# Patient Record
Sex: Male | Born: 1995 | Race: Black or African American | Hispanic: No | Marital: Single | Smoking: Current every day smoker
Health system: Southern US, Community
[De-identification: ages and names within clinical notes are randomized; demographics above are authoritative.]

## PROBLEM LIST (undated history)

## (undated) DIAGNOSIS — F319 Bipolar disorder, unspecified: Secondary | ICD-10-CM

## (undated) DIAGNOSIS — F1220 Cannabis dependence, uncomplicated: Secondary | ICD-10-CM

## (undated) DIAGNOSIS — F29 Unspecified psychosis not due to a substance or known physiological condition: Secondary | ICD-10-CM

---
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
## 2015-01-11 IMAGING — CT CT HEAD W/O CM
1 series · 16 of 30 positions shown, 20 images · non-contrast
Comparison: None.

CLINICAL DATA: Altered mental status.

EXAM:
CT HEAD WITHOUT CONTRAST
TECHNIQUE: Contiguous axial images were obtained from the base of the skull
through the vertex without intravenous contrast.

[Series 2: head 5.0 h30s · axial · 0.44mm/px · z∈[-95,+40]mm · 16 of 31 slices shown, 20 images]
[im 2/31  brain]
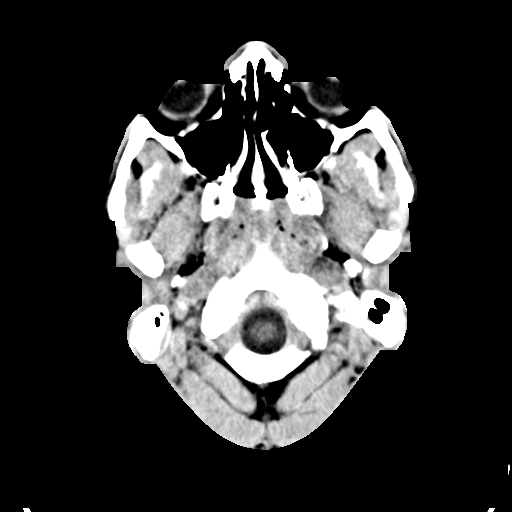
[im 2/31  bone]
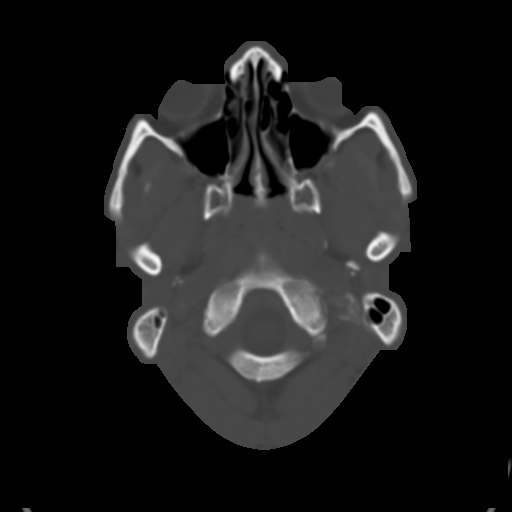
[im 4/31  brain]
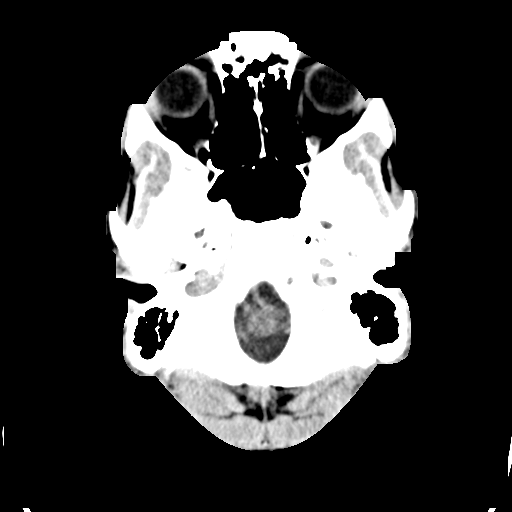
[im 6/31  brain]
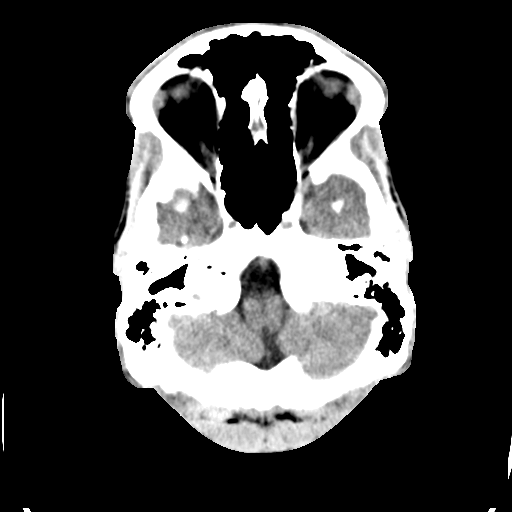
[im 8/31  brain]
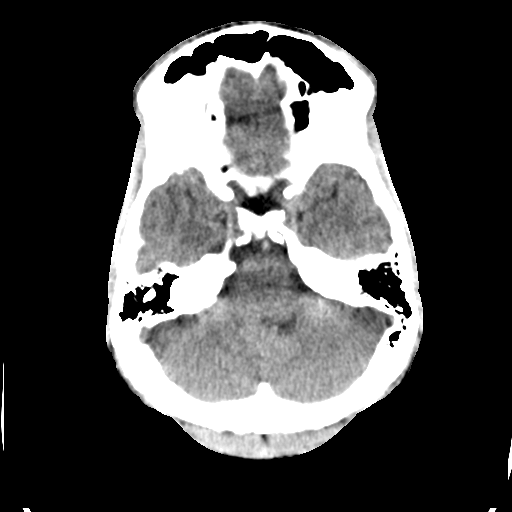
[im 9/31  brain]
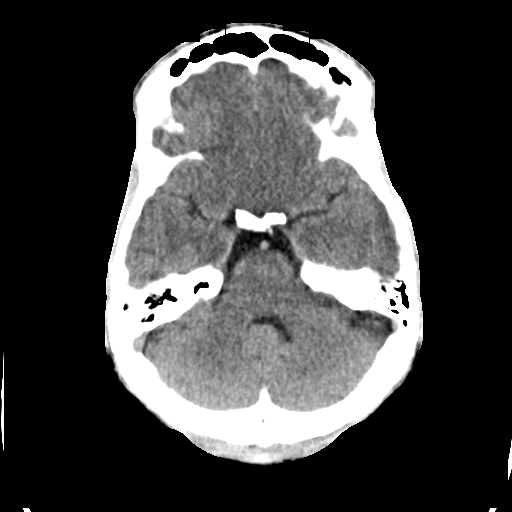
[im 9/31  bone]
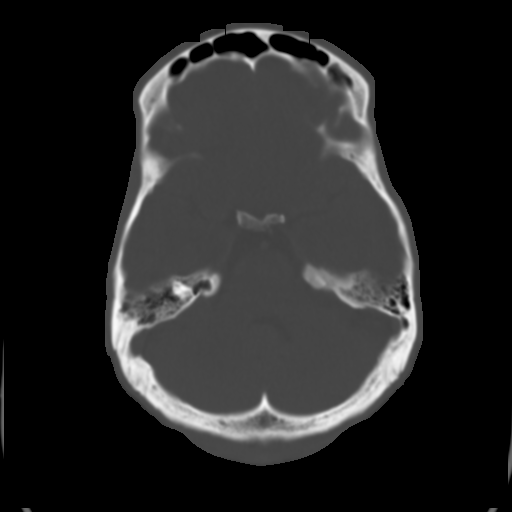
[im 11/31  brain]
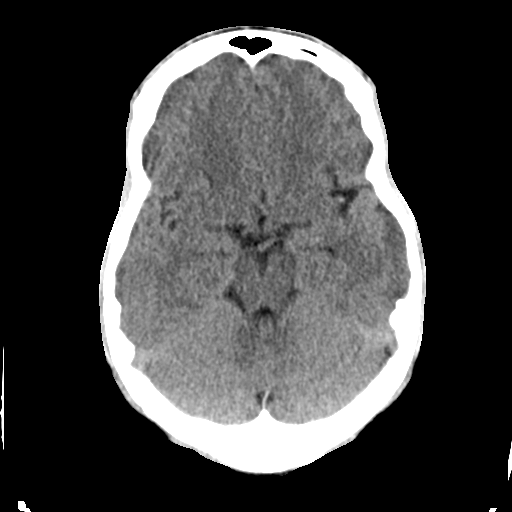
[im 13/31  brain]
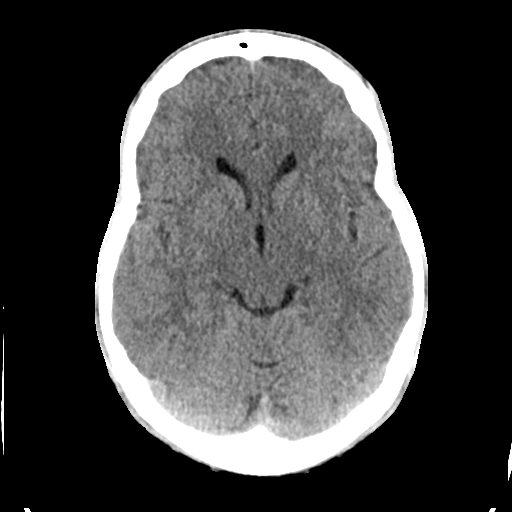
[im 15/31  brain]
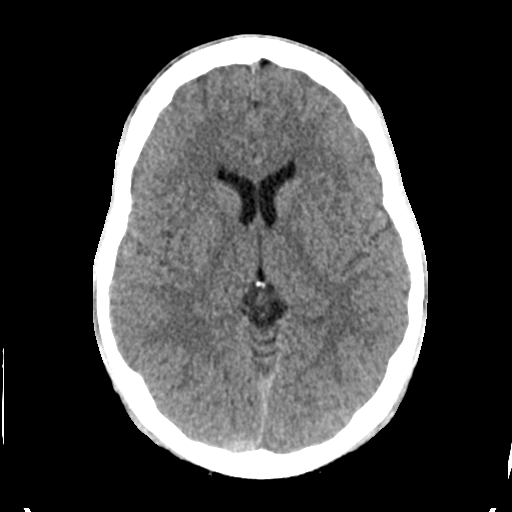
[im 16/31  brain]
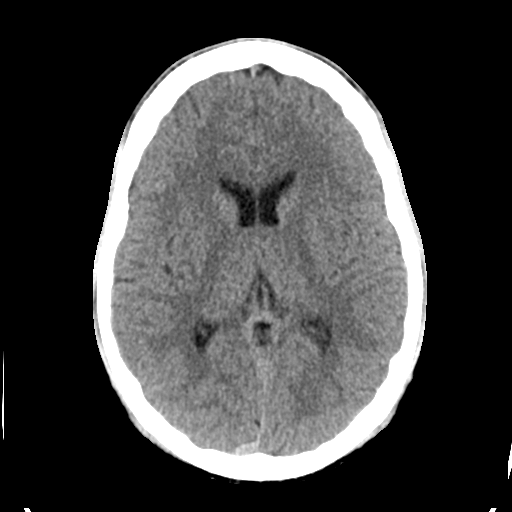
[im 16/31  bone]
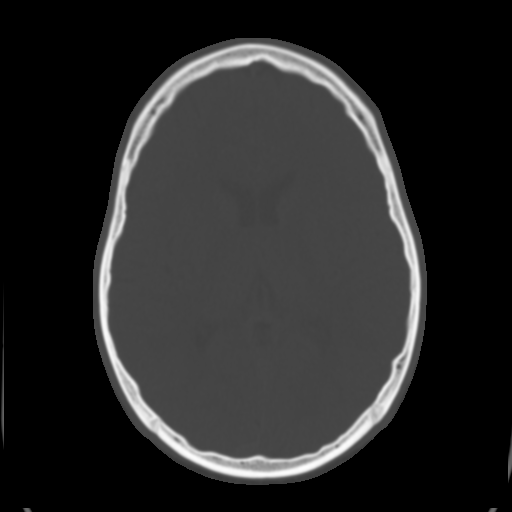
[im 18/31  brain]
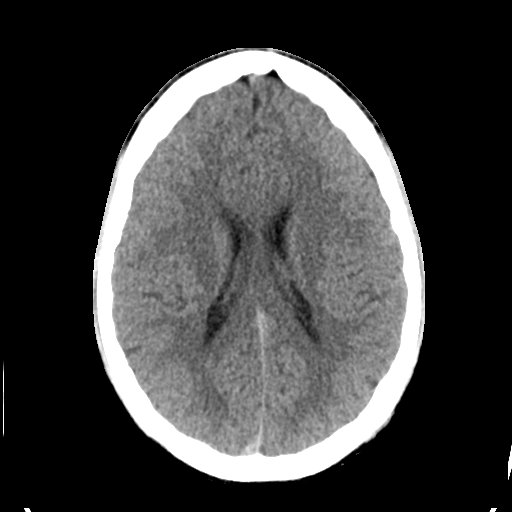
[im 20/31  brain]
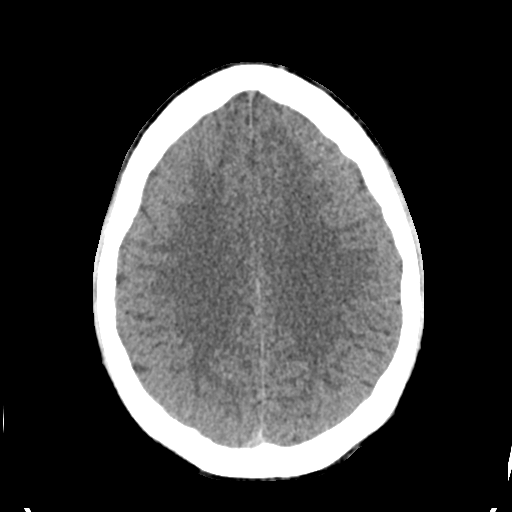
[im 22/31  brain]
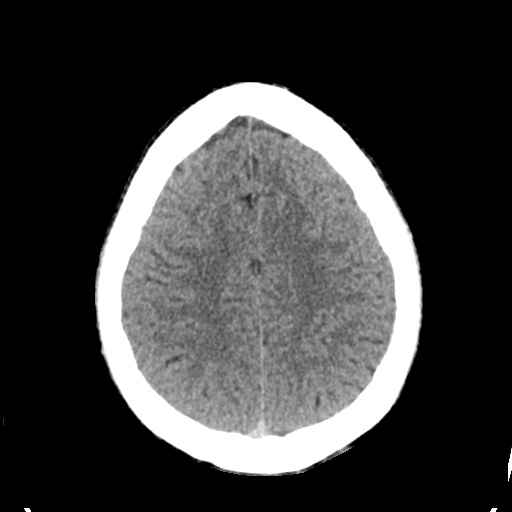
[im 23/31  brain]
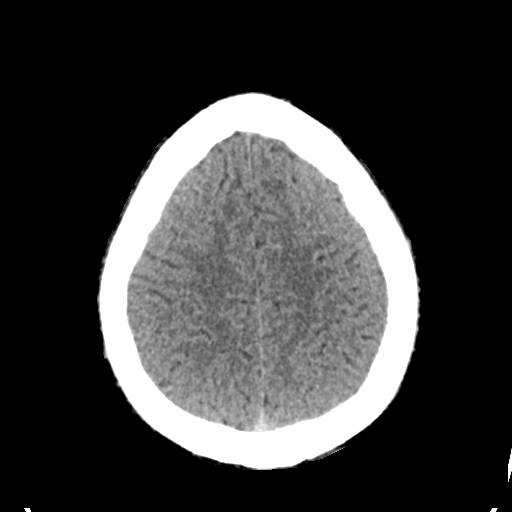
[im 23/31  bone]
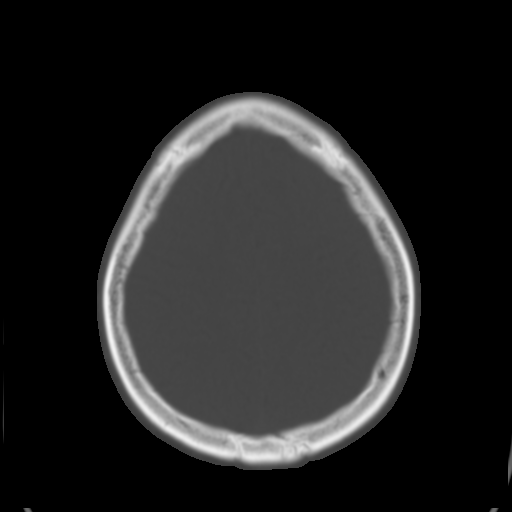
[im 25/31  brain]
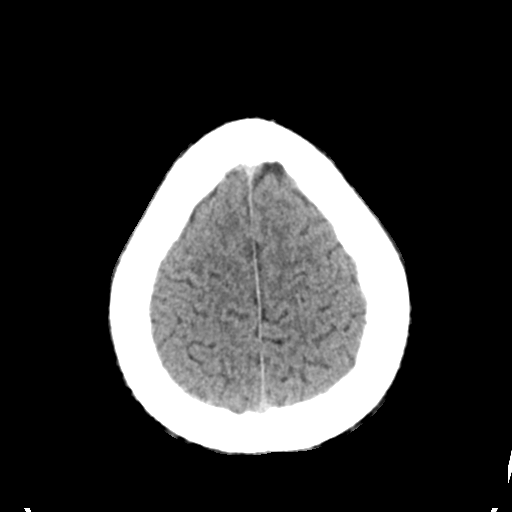
[im 27/31  brain]
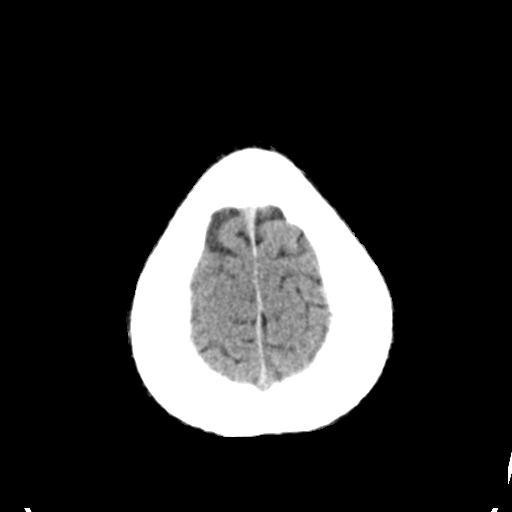
[im 29/31  brain]
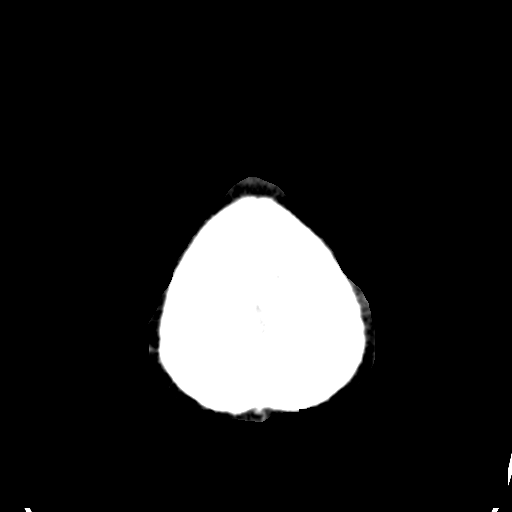

[16 of 30 positions shown; findings below may reference images not displayed]

FINDINGS: Normal appearing cerebral hemispheres and posterior fossa
structures. Normal size and position of the ventricles. No
intracranial hemorrhage, mass lesion or CT evidence of acute
infarction. Pneumatization of the left middle nasal turbinate with
mild deviation of the mid portion of the nasal septum to the right.
IMPRESSION: No acute abnormality.

## 2015-08-07 ENCOUNTER — Emergency Department (HOSPITAL_COMMUNITY)
Admission: EM | Admit: 2015-08-07 | Discharge: 2015-08-09 | Disposition: A | Discharge status: Other Acute Inpatient Hospital | Payer: Medicaid Other | Attending: Emergency Medicine | Admitting: Emergency Medicine

## 2015-08-07 DIAGNOSIS — F17210 Nicotine dependence, cigarettes, uncomplicated: Secondary | ICD-10-CM | POA: Diagnosis not present

## 2015-08-07 DIAGNOSIS — F3110 Bipolar disorder, current episode manic without psychotic features, unspecified: Secondary | ICD-10-CM

## 2015-08-07 DIAGNOSIS — F1220 Cannabis dependence, uncomplicated: Secondary | ICD-10-CM | POA: Diagnosis present

## 2015-08-07 DIAGNOSIS — Z046 Encounter for general psychiatric examination, requested by authority: Secondary | ICD-10-CM | POA: Insufficient documentation

## 2015-08-07 DIAGNOSIS — F319 Bipolar disorder, unspecified: Secondary | ICD-10-CM | POA: Diagnosis not present

## 2015-08-07 HISTORY — DX: Bipolar disorder, unspecified: F31.9

## 2015-08-07 HISTORY — DX: Unspecified psychosis not due to a substance or known physiological condition: F29

## 2015-08-07 HISTORY — DX: Cannabis dependence, uncomplicated: F12.20

## 2015-08-08 ENCOUNTER — Encounter (HOSPITAL_COMMUNITY): Payer: Self-pay | Admitting: Emergency Medicine

## 2015-08-08 DIAGNOSIS — F1220 Cannabis dependence, uncomplicated: Secondary | ICD-10-CM | POA: Diagnosis not present

## 2015-08-08 DIAGNOSIS — F3110 Bipolar disorder, current episode manic without psychotic features, unspecified: Secondary | ICD-10-CM

## 2015-08-08 LAB — COMPREHENSIVE METABOLIC PANEL
ALBUMIN: 4.8 g/dL (ref 3.5–5.0)
ALT: 23 U/L (ref 17–63)
ANION GAP: 12 (ref 5–15)
AST: 29 U/L (ref 15–41)
Alkaline Phosphatase: 57 U/L (ref 38–126)
BILIRUBIN TOTAL: 1.5 mg/dL — AB (ref 0.3–1.2)
BUN: 17 mg/dL (ref 6–20)
CALCIUM: 9.3 mg/dL (ref 8.9–10.3)
CO2: 23 mmol/L (ref 22–32)
Chloride: 102 mmol/L (ref 101–111)
Creatinine, Ser: 1.07 mg/dL (ref 0.61–1.24)
GFR calc Af Amer: >60 mL/min (ref >60)
GFR calc non Af Amer: >60 mL/min (ref >60)
GLUCOSE: 82 mg/dL (ref 65–99)
Potassium: 3.3 mmol/L — ABNORMAL LOW (ref 3.5–5.1)
Sodium: 137 mmol/L (ref 135–145)
Total Protein: 7.6 g/dL (ref 6.5–8.1)

## 2015-08-08 LAB — CBC WITH DIFFERENTIAL/PLATELET
BASOS ABS: 0.1 10*3/uL (ref 0.0–0.1)
BASOS PCT: 1 %
Eosinophils Absolute: 0 10*3/uL (ref 0.0–0.7)
Eosinophils Relative: 0 %
HEMATOCRIT: 42.2 % (ref 39.0–52.0)
HEMOGLOBIN: 14.5 g/dL (ref 13.0–17.0)
LYMPHS PCT: 19 %
Lymphs Abs: 2.1 10*3/uL (ref 0.7–4.0)
MCH: 30.1 pg (ref 26.0–34.0)
MCHC: 34.4 g/dL (ref 30.0–36.0)
MCV: 87.6 fL (ref 78.0–100.0)
Monocytes Absolute: 0.7 10*3/uL (ref 0.1–1.0)
Monocytes Relative: 6 %
NEUTROS ABS: 8.2 10*3/uL — AB (ref 1.7–7.7)
Neutrophils Relative %: 74 %
Platelets: 240 10*3/uL (ref 150–400)
RBC: 4.82 MIL/uL (ref 4.22–5.81)
RDW: 12.8 % (ref 11.5–15.5)
WBC: 11.1 10*3/uL — ABNORMAL HIGH (ref 4.0–10.5)

## 2015-08-08 LAB — SALICYLATE LEVEL: Salicylate Lvl: <4 mg/dL (ref 2.8–30.0)

## 2015-08-08 LAB — ACETAMINOPHEN LEVEL: Acetaminophen (Tylenol), Serum: <10 ug/mL — ABNORMAL LOW (ref 10–30)

## 2015-08-08 LAB — ETHANOL: Alcohol, Ethyl (B): <5 mg/dL (ref <5)

## 2015-08-08 MED ORDER — LORAZEPAM 2 MG/ML IJ SOLN
2.0000 mg | Freq: Once | INTRAMUSCULAR | Status: DC
  Filled 2015-08-08: qty 1

## 2015-08-08 MED ORDER — LORAZEPAM 2 MG/ML IJ SOLN
2.0000 mg | Freq: Once | INTRAMUSCULAR | Status: AC
  Administered 2015-08-08: 2 mg via INTRAMUSCULAR
  Filled 2015-08-08: qty 1

## 2015-08-08 MED ORDER — HYDROXYZINE HCL 25 MG PO TABS
25.0000 mg | ORAL_TABLET | Freq: Two times a day (BID) | ORAL | Status: DC
  Filled 2015-08-08 (×2): qty 1

## 2015-08-08 MED ORDER — TRAZODONE HCL 50 MG PO TABS
150.0000 mg | ORAL_TABLET | Freq: Every day | ORAL | Status: DC
Start: 2015-08-08 — End: 2015-08-09

## 2015-08-08 MED ORDER — LORAZEPAM 1 MG PO TABS: 1.0000 mg | ORAL_TABLET | Freq: Four times a day (QID) | ORAL | Status: DC | PRN

## 2015-08-08 MED ORDER — ACETAMINOPHEN 325 MG PO TABS: 650.0000 mg | ORAL_TABLET | ORAL | Status: DC | PRN

## 2015-08-08 MED ORDER — OXCARBAZEPINE 300 MG PO TABS
300.0000 mg | ORAL_TABLET | Freq: Two times a day (BID) | ORAL | Status: DC
  Filled 2015-08-08 (×2): qty 1

## 2015-08-08 MED ORDER — LORAZEPAM 2 MG/ML IJ SOLN
2.0000 mg | Freq: Once | INTRAMUSCULAR | Status: AC
  Administered 2015-08-08: 2 mg via INTRAMUSCULAR

## 2015-08-08 MED ORDER — DIPHENHYDRAMINE HCL 50 MG/ML IJ SOLN
50.0000 mg | Freq: Once | INTRAMUSCULAR | Status: AC
  Administered 2015-08-08: 50 mg via INTRAMUSCULAR
  Filled 2015-08-08: qty 1

## 2015-08-08 MED ORDER — ZIPRASIDONE MESYLATE 20 MG IM SOLR
20.0000 mg | Freq: Once | INTRAMUSCULAR | Status: AC
  Administered 2015-08-08: 20 mg via INTRAMUSCULAR
  Filled 2015-08-08: qty 20

## 2015-08-08 MED ORDER — DIPHENHYDRAMINE HCL 50 MG/ML IJ SOLN
100.0000 mg | Freq: Once | INTRAMUSCULAR | Status: DC
  Filled 2015-08-08: qty 2

## 2015-08-08 MED ORDER — STERILE WATER FOR INJECTION IJ SOLN
INTRAMUSCULAR | Status: AC
  Administered 2015-08-08: 21:00:00
  Filled 2015-08-08: qty 10

## 2015-08-08 MED ORDER — TRAZODONE HCL 50 MG PO TABS: 150.0000 mg | ORAL_TABLET | Freq: Every day | ORAL | Status: DC

## 2015-08-08 MED ORDER — OLANZAPINE 10 MG PO TBDP
10.0000 mg | ORAL_TABLET | Freq: Three times a day (TID) | ORAL | Status: DC | PRN
  Administered 2015-08-09: 10 mg via ORAL
  Filled 2015-08-08 (×2): qty 1

## 2015-08-08 MED ORDER — ARIPIPRAZOLE 15 MG PO TABS
15.0000 mg | ORAL_TABLET | Freq: Every day | ORAL | Status: DC
  Filled 2015-08-08 (×2): qty 1

## 2015-08-08 MED ORDER — DIPHENHYDRAMINE HCL 50 MG/ML IJ SOLN
100.0000 mg | Freq: Once | INTRAMUSCULAR | Status: AC
  Administered 2015-08-08: 100 mg via INTRAMUSCULAR

## 2015-08-08 MED ORDER — HALOPERIDOL 2 MG PO TABS
2.0000 mg | ORAL_TABLET | Freq: Two times a day (BID) | ORAL | Status: DC
  Filled 2015-08-08: qty 1

## 2015-08-08 MED ORDER — STERILE WATER FOR INJECTION IJ SOLN
INTRAMUSCULAR | Status: AC
  Administered 2015-08-08: 10 mL
  Filled 2015-08-08: qty 10

## 2015-08-08 NOTE — ED Notes (Signed)
Pt. Pacing, refusing to throw supper tray away, cursing when redirection attempted.

## 2015-08-08 NOTE — ED Notes (Signed)
Pt. Transferred to [facility] from ED to [facility] Pt. Oriented to unit including Q15 minute rounds as well as the security cameras for their protection. Patient is alert, warm and dry in no acute distress. Patient denies SI, HI, and AVH. Pt. Encouraged to let me know if needs arise.

## 2015-08-08 NOTE — ED Notes (Signed)
Pt. Allowed injection without complaint.

## 2015-08-08 NOTE — ED Notes (Signed)
Pt has been sleeping most of the afternoon. His appetite is good and he eats all of the food brought to him. He continues to refuse po medications.

## 2015-08-08 NOTE — ED Notes (Signed)
Pt walks around the unit without a shirt on and does not comply with staff request to put his shirt on. He is manic and his speech is rambling, tangential and is not coherent. He uses word salad. Occasionally he turns on the emergency alarm in his room. He seems disoriented and not understanding his admission to this unit. He is not unpleasant with staff or other patients.

## 2015-08-08 NOTE — ED Notes (Signed)
Patient comes from brother house. He has been rambling like this for three days. Patient has not been taking any of his medicine that he was on. Brother could not remember the medicines that patient is on. Patient has not been sleeping. Brother called EMS due to the rambling.

## 2015-08-08 NOTE — ED Notes (Signed)
Patient noted in room. No complaints, stable, in no acute distress. Q15 minute rounds and monitoring via Security Cameras to continue.  

## 2015-08-08 NOTE — ED Provider Notes (Signed)
CSN: [id number]     Arrival date & time 08/07/15  2358 History   .By signing my name below, I, Suzan SlickAshley N. Elon SpannerLeger, attest that this documentation has been prepared under the direction and in the presence of Richardean Canalavid H Dameir Gentzler, MD.  Electronically Signed: Suzan SlickAshley N. Elon SpannerLeger, ED Scribe. 08/08/2015. 12:20 AM.   Chief Complaint  Patient presents with  . Medical Clearance   The history is provided by the patient. No language interpreter was used.     LEVEL 5 CAVEAT DUE TO PSYCHOSIS   HPI Comments: Bryan Tran brought in by EMS from brothers house is a 20 y.o. male with a PMHx of Bipolar disorder who presents to the Emergency Department here for medical clearance this evening. Pt was brought in after his brother called EMS as pt was rambling. He is not currently under any IVC paperwork. Pt states "I will be an attorney that will prosecute everyone." States "I dont care about my family and want harm done with my family." No SI at this time. Pt denies being depressed but has been off of his medication for some time now.  PCP: No PCP Per Patient    Past Medical History  Diagnosis Date  . Bipolar I disorder (HCC) manic  . Psychosis   . Marihuana dependence (HCC)    History reviewed. No pertinent past surgical history. History reviewed. No pertinent family history. Social History  Substance Use Topics  . Smoking status: Never Smoker   . Smokeless tobacco: None  . Alcohol Use: No    Review of Systems  Unable to perform ROS: Psychiatric disorder      Allergies  Risperdal  Home Medications   Prior to Admission medications   Medication Sig Start Date End Date Taking? Authorizing Provider  ARIPiprazole (ABILIFY) 15 MG tablet Take 1 tablet (15 mg total) by mouth daily. Patient not taking: Reported on 08/08/2015 09/17/13   Thermon LeylandLaura A Davis, NP  ARIPiprazole 400 MG SUSR Inject 400 mg into the muscle every 28 (twenty-eight) days. Next dose due 10/10/13. Patient not taking: Reported on 08/08/2015 10/10/13    Thermon LeylandLaura A Davis, NP  carbamazepine (EQUETRO) 200 MG CP12 12 hr capsule Take 1 capsule (200 mg total) by mouth 2 (two) times daily. Patient not taking: Reported on 08/08/2015 09/17/13   Thermon LeylandLaura A Davis, NP  traZODone (DESYREL) 150 MG tablet Take 1 tablet (150 mg total) by mouth at bedtime. Patient not taking: Reported on 08/08/2015 09/17/13   Thermon LeylandLaura A Davis, NP   Triage Vitals: BP 108/69 mmHg  Pulse 70  Temp(Src) 98.8 F (37.1 C) (Oral)  Resp 14  Ht 6' (1.829 m)  Wt 160 lb (72.576 kg)  BMI 21.70 kg/m2  SpO2 95%   Physical Exam  Constitutional: He appears well-developed and well-nourished.  HENT:  Head: Normocephalic.  Eyes: EOM are normal.  Neck: Normal range of motion.  Cardiovascular: Normal rate, regular rhythm and normal heart sounds.   Pulmonary/Chest: Effort normal and breath sounds normal. No respiratory distress. He has no wheezes. He has no rales.  Abdominal: He exhibits no distension.  Musculoskeletal: Normal range of motion.  Neurological: He is alert.  Psychiatric:  Agitated Poor judgement  Refuses to answer many questions   Nursing note and vitals reviewed.   ED Course  Procedures (including critical care time)  DIAGNOSTIC STUDIES: Oxygen Saturation is 100% on RA, Normal by my interpretation.    COORDINATION OF CARE: 12:03 AM- Will order ethanol, blood work. Will give Ativan and Benadryl.  Discussed treatment plan with pt at bedside and pt agreed to plan.     Labs Review Labs Reviewed  CBC WITH DIFFERENTIAL/PLATELET - Abnormal; Notable for the following:    WBC 11.1 (*)    Neutro Abs 8.2 (*)    All other components within normal limits  COMPREHENSIVE METABOLIC PANEL - Abnormal; Notable for the following:    Potassium 3.3 (*)    Total Bilirubin 1.5 (*)    All other components within normal limits  ACETAMINOPHEN LEVEL - Abnormal; Notable for the following:    Acetaminophen (Tylenol), Serum <10 (*)    All other components within normal limits  ETHANOL   SALICYLATE LEVEL  URINE RAPID DRUG SCREEN, HOSP PERFORMED    Imaging Review No results found. I have personally reviewed and evaluated these images and lab results as part of my medical decision-making.   EKG Interpretation None      MDM   Final diagnoses:  None    Bryan Tran is a 20 y.o. male here with psychosis. Patient has hx of bipolar and was hospitalized before. He is psychotic and agitated. IVC paperwork done by me. Given haldol, ativan.   4:17 AM Labs unremarkable. Medically cleared. TTS to see patient.    I personally performed the services described in this documentation, which was scribed in my presence. The recorded information has been reviewed and is accurate.   Richardean Canal, MD 08/08/15 [phone]

## 2015-08-08 NOTE — ED Notes (Signed)
Patient noted sleeping in room. No complaints, stable, in no acute distress. Q15 minute rounds and monitoring via Security Cameras to continue.  

## 2015-08-08 NOTE — ED Notes (Signed)
Pt. Responding to internal stimuli, intermittently cursing.

## 2015-08-08 NOTE — Consult Note (Signed)
[facility] Consult   Reason for Consult:  Hallucinations  Referring Physician:  EDP Patient Identification: Panagiotis Oelkers MRN:  [id number] Principal Diagnosis: Bipolar I disorder, most recent episode (or current) manic ([place]) Diagnosis:   Patient Active Problem List   Diagnosis Date Noted  . Cannabis use disorder, severe, dependence ([place]) [F12.20] 08/08/2015    Priority: High  . Bipolar I disorder, most recent episode (or current) manic ([place]) [F31.10] 09/10/2013    Priority: High  . Marijuana dependence ([place]) [F12.20] 09/07/2013  . Unspecified psychosis [F29] 09/06/2013  . Psychosis [F29] 09/06/2013    Total Time spent with patient: 45 minutes  Subjective:   Bryan Tran is a 20 y.o. male patient admitted with mania.  HPI:  20 yo male who presented to the ED with mania.  He was restless earlier and delusional thinking he was from [place] with an accent.  Pleasant and took Geodon IM willingly.  He uses marijuana daily, large amounts.  Denies suicidal/homicidal ideations and alcohol abuse.  He is tangential with increase in energy, admission needed.  Past Psychiatric History: bipolar disorder, marijuana abuse  Risk to Self: Suicidal Ideation:  (UTA) Suicidal Intent:  (UTA) Is patient at risk for suicide?:  (UTA) Suicidal Plan?:  (UTA) Access to Means:  (UTA) What has been your use of drugs/alcohol within the last 12 months?:  (UTA) How many times?:  (UTA) Other Self Harm Risks:  (UTA) Triggers for Past Attempts:  (UTA) Intentional Self Injurious Behavior:  (UTA) Risk to Others: Homicidal Ideation:  (UTA) Thoughts of Harm to Others:  (UTA) Current Homicidal Intent:  (UTA) Current Homicidal Plan:  (UTA) Access to Homicidal Means:  (UTA) Identified Victim:  (UTA) History of harm to others?:  (UTA) Assessment of Violence:  (UTA) Violent Behavior Description:  (UTA) Does patient have access to weapons?:  (UTA) Criminal Charges Pending?:  (UTA) Does  patient have a court date:  Special educational needs teacher) Prior Inpatient Therapy: Prior Inpatient Therapy:  (UTA) Prior Therapy Dates:  (UTA) Prior Therapy Facilty/Provider(s):  (UTA) Reason for Treatment:  (UTA) Prior Outpatient Therapy: Prior Outpatient Therapy:  (UTA) Prior Therapy Dates:  (UTA) Prior Therapy Facilty/Provider(s):  (UTA) Reason for Treatment:  (UTA) Does patient have an ACCT team?:  (UTA) Does patient have Intensive In-House Services?  : Unknown Does patient have [facility] services? : Unknown Does patient have P4CC services?: Unknown  Past Medical History:  Past Medical History  Diagnosis Date  . Bipolar I disorder (HCC) manic  . Psychosis   . Marihuana dependence ([place])    History reviewed. No pertinent past surgical history. Family History: History reviewed. No pertinent family history. Family Psychiatric  History: none Social History:  History  Alcohol Use No     History  Drug Use  . Yes  . Special: Marijuana    Social History   Social History  . Marital Status: Single    Spouse Name: N/A  . Number of Children: N/A  . Years of Education: N/A   Social History Main Topics  . Smoking status: Never Smoker   . Smokeless tobacco: None  . Alcohol Use: No  . Drug Use: Yes    Special: Marijuana  . Sexual Activity: Not Asked   Other Topics Concern  . None   Social History Narrative   Additional Social History:    Allergies:   Allergies  Allergen Reactions  . Risperdal [Risperidone]     Patient notes that he complained of "tongue getting thick and having difficulty speaking."  Labs:  Results for orders placed or performed during the hospital encounter of 08/07/15 (from the past 48 hour(s))  CBC with Differential     Status: Abnormal   Collection Time: 08/08/15  1:37 AM  Result Value Ref Range   WBC 11.1 (H) 4.0 - 10.5 K/uL   RBC 4.82 4.22 - 5.81 MIL/uL   Hemoglobin 14.5 13.0 - 17.0 g/dL   HCT 42.2 39.0 - 52.0 %   MCV 87.6 78.0 - 100.0 fL   MCH 30.1 26.0 -  34.0 pg   MCHC 34.4 30.0 - 36.0 g/dL   RDW 12.8 11.5 - 15.5 %   Platelets 240 150 - 400 K/uL   Neutrophils Relative % 74 %   Lymphocytes Relative 19 %   Monocytes Relative 6 %   Eosinophils Relative 0 %   Basophils Relative 1 %   Neutro Abs 8.2 (H) 1.7 - 7.7 K/uL   Lymphs Abs 2.1 0.7 - 4.0 K/uL   Monocytes Absolute 0.7 0.1 - 1.0 K/uL   Eosinophils Absolute 0.0 0.0 - 0.7 K/uL   Basophils Absolute 0.1 0.0 - 0.1 K/uL   WBC Morphology ATYPICAL LYMPHOCYTES   Comprehensive metabolic panel     Status: Abnormal   Collection Time: 08/08/15  1:37 AM  Result Value Ref Range   Sodium 137 135 - 145 mmol/L   Potassium 3.3 (L) 3.5 - 5.1 mmol/L   Chloride 102 101 - 111 mmol/L   CO2 23 22 - 32 mmol/L   Glucose, Bld 82 65 - 99 mg/dL   BUN 17 6 - 20 mg/dL   Creatinine, Ser 1.07 0.61 - 1.24 mg/dL   Calcium 9.3 8.9 - 10.3 mg/dL   Total Protein 7.6 6.5 - 8.1 g/dL   Albumin 4.8 3.5 - 5.0 g/dL   AST 29 15 - 41 U/L   ALT 23 17 - 63 U/L   Alkaline Phosphatase 57 38 - 126 U/L   Total Bilirubin 1.5 (H) 0.3 - 1.2 mg/dL   GFR calc non Af Amer >60 >60 mL/min   GFR calc Af Amer >60 >60 mL/min    Comment: (NOTE) The eGFR has been calculated using the CKD EPI equation. This calculation has not been validated in all clinical situations. eGFR's persistently <60 mL/min signify possible Chronic Kidney Disease.    Anion gap 12 5 - 15  Ethanol     Status: None   Collection Time: 08/08/15  1:37 AM  Result Value Ref Range   Alcohol, Ethyl (B) <5 <5 mg/dL    Comment:        LOWEST DETECTABLE LIMIT FOR SERUM ALCOHOL IS 5 mg/dL FOR MEDICAL PURPOSES ONLY   Salicylate level     Status: None   Collection Time: 08/08/15  1:37 AM  Result Value Ref Range   Salicylate Lvl <1.6 2.8 - 30.0 mg/dL  Acetaminophen level     Status: Abnormal   Collection Time: 08/08/15  1:37 AM  Result Value Ref Range   Acetaminophen (Tylenol), Serum <10 (L) 10 - 30 ug/mL    Comment:        THERAPEUTIC CONCENTRATIONS  VARY SIGNIFICANTLY. A RANGE OF 10-30 ug/mL MAY BE AN EFFECTIVE CONCENTRATION FOR MANY PATIENTS. HOWEVER, SOME ARE BEST TREATED AT CONCENTRATIONS OUTSIDE THIS RANGE. ACETAMINOPHEN CONCENTRATIONS >150 ug/mL AT 4 HOURS AFTER INGESTION AND >50 ug/mL AT 12 HOURS AFTER INGESTION ARE OFTEN ASSOCIATED WITH TOXIC REACTIONS.     Current Facility-Administered Medications  Medication Dose Route Frequency Provider Last Rate Last  Dose  . acetaminophen (TYLENOL) tablet 650 mg  650 mg Oral Q4H PRN Wandra Arthurs, MD      . haloperidol (HALDOL) tablet 2 mg  2 mg Oral BID Corena Pilgrim, MD      . hydrOXYzine (ATARAX/VISTARIL) tablet 25 mg  25 mg Oral BID PC Ralf Konopka, MD      . OLANZapine zydis (ZYPREXA) disintegrating tablet 10 mg  10 mg Oral Q8H PRN Zygmund Passero, MD      . Oxcarbazepine (TRILEPTAL) tablet 300 mg  300 mg Oral BID Cordelro Gautreau, MD      . traZODone (DESYREL) tablet 150 mg  150 mg Oral QHS Corena Pilgrim, MD       Current Outpatient Prescriptions  Medication Sig Dispense Refill  . ARIPiprazole (ABILIFY) 15 MG tablet Take 1 tablet (15 mg total) by mouth daily. (Patient not taking: Reported on 08/08/2015) 30 tablet 0  . ARIPiprazole 400 MG SUSR Inject 400 mg into the muscle every 28 (twenty-eight) days. Next dose due 10/10/13. (Patient not taking: Reported on 08/08/2015) 1 each 3  . carbamazepine (EQUETRO) 200 MG CP12 12 hr capsule Take 1 capsule (200 mg total) by mouth 2 (two) times daily. (Patient not taking: Reported on 08/08/2015) 60 each 0  . traZODone (DESYREL) 150 MG tablet Take 1 tablet (150 mg total) by mouth at bedtime. (Patient not taking: Reported on 08/08/2015) 30 tablet 0    Musculoskeletal: Strength & Muscle Tone: within normal limits Gait & Station: normal Patient leans: N/A  Psychiatric Specialty Exam: Physical Exam  Constitutional: He is oriented to person, place, and time. He appears well-developed and well-nourished.  HENT:  Head: Normocephalic.  Neck:  Normal range of motion.  Respiratory: Effort normal.  GI: Soft.  Musculoskeletal: Normal range of motion.  Neurological: He is alert and oriented to person, place, and time.  Skin: Skin is warm and dry.  Psychiatric: His speech is normal. His mood appears anxious. He is actively hallucinating. Thought content is delusional. Cognition and memory are impaired. He expresses impulsivity.    Review of Systems  Constitutional: Negative.   HENT: Negative.   Eyes: Negative.   Respiratory: Negative.   Cardiovascular: Negative.   Gastrointestinal: Negative.   Genitourinary: Negative.   Musculoskeletal: Negative.   Skin: Negative.   Neurological: Negative.   Endo/Heme/Allergies: Negative.   Psychiatric/Behavioral: Positive for hallucinations and substance abuse.    Blood pressure 138/80, pulse 55, temperature 97.8 F (36.6 C), temperature source Oral, resp. rate 16, height 6' (1.829 m), weight 72.576 kg (160 lb), SpO2 99 %.Body mass index is 21.7 kg/(m^2).  General Appearance: Casual  Eye Contact:  Good  Speech:  Pressured  Volume:  Normal  Mood:  Anxious and Euphoric  Affect:  Congruent  Thought Process:  Descriptions of Associations: Tangential  Orientation:  Full (Time, Place, and Person)  Thought Content:  Delusions and Hallucinations: Auditory  Suicidal Thoughts:  No  Homicidal Thoughts:  No  Memory:  Immediate;   Fair Recent;   Poor Remote;   Good  Judgement:  Impaired  Insight:  Lacking  Psychomotor Activity:  Increased  Concentration:  Concentration: Poor and Attention Span: Poor  Recall:  [employer] of Knowledge:  Fair  Language:  Good  Akathisia:  No  Handed:  Right  AIMS (if indicated):     Assets:  Leisure Time Physical Health Resilience Social Support  ADL's:  Intact  Cognition:  Impaired,  Mild  Sleep:  Treatment Plan Summary: Daily contact with patient to assess and evaluate symptoms and progress in treatment, Medication management and Plan bipolar  affective disorder, mania, severe with psychosis:  -Crisis stabilization -Medication management:  Geodon 20 mg once for agitation IM, last night Benadryl 50 mg IM and Ativan 2 mg IM given for agitation.  Started Haldol 2 mg BID for psychosis, Vistaril 25 mg BID for anxiety, Zyprexa 10 mg every 8 hours PRN agitation, Trazodone 150 mg at bedtime for sleep, and Trileptal 300 mg BID for mood stabilization. -Individual and substance counseling  Disposition: Recommend psychiatric Inpatient admission when medically cleared.  Waylan Boga, NP 08/08/2015 12:55 PM   Patient seen face-to-face for psychiatric evaluation, chart reviewed and case discussed with the physician extender and developed treatment plan. Reviewed the information documented and agree with the treatment plan. Corena Pilgrim, MD

## 2015-08-08 NOTE — ED Notes (Signed)
Pt. Attempting to grab electronic signature pads. Pads pulled into nurses station and redirection attempted with limited success.

## 2015-08-08 NOTE — BH Assessment (Signed)
Assessment Note  Bryan Tran is an 20 y.o. male placed under IVC by Dr. Silverio LayYao in [facility].  IVC states:  Patient has been hallucinating and thinks that he is an attorney and will prosecute all crime. He has been threatening to harm his family. Hasn't been taking his psych meds and had previous psych admissions.    Patient appeared to be experiencing word salad during the assessment. Patient was unable to provide his name and date of birth to this Clinical research associatewriter. Patient would speak in word salad to answer questions. Patient denies SI and HI to his nurse. Per chart review [facility] with Unspecified Schizophrenia Spectrum, Acute Mania, and Cannabis Use Disorder. Patient UDS not collected and BAL <5 at time of assessment.   Consulted with Maryjean Mornharles Kober, PA-C who recommends inpatient treatment at this time.    Diagnosis: Unspecified Schizophrenia Spectrum per patient history  Past Medical History:  Past Medical History  Diagnosis Date  . Bipolar I disorder (HCC) manic  . Psychosis   . Marihuana dependence (HCC)     History reviewed. No pertinent past surgical history.  Family History: History reviewed. No pertinent family history.  Social History:  reports that he has never smoked. He does not have any smokeless tobacco history on file. He reports that he uses illicit drugs (Marijuana). He reports that he does not drink alcohol.  Additional Social History:  Alcohol / Drug Use Pain Medications: See PTA Prescriptions: See PTA Over the Counter: See PTA History of alcohol / drug use?:  (UTA)  CIWA: CIWA-Ar BP: 108/69 mmHg Pulse Rate: 70 COWS:    Allergies:  Allergies  Allergen Reactions  . Risperdal [Risperidone]     Patient notes that he complained of "tongue getting thick and having difficulty speaking."    Home Medications:  (Not in a hospital admission)  OB/GYN Status:  No LMP for male patient.  General Assessment Data Location of  Assessment: [facility] TTS Assessment: In system Is this a Tele or Face-to-Face Assessment?: Face-to-Face Is this an Initial Assessment or a Re-assessment for this encounter?: Initial Assessment Marital status:  (UTA) Is patient pregnant?: No Pregnancy Status: No Living Arrangements:  (UTA) Can pt return to current living arrangement?:  (UTA) Admission Status: Involuntary Is patient capable of signing voluntary admission?: No     Crisis Care Plan Living Arrangements:  (UTA) Name of Psychiatrist:  (UTA) Name of Therapist:  67(UTA)  Education Status Is patient currently in school?:  (UTA) Highest grade of school patient has completed:  (UTA)  Risk to self with the past 6 months Suicidal Ideation:  (UTA) Has patient been a risk to self within the past 6 months prior to admission? :  (UTA) Suicidal Intent:  (UTA) Has patient had any suicidal intent within the past 6 months prior to admission? :  (UTA) Is patient at risk for suicide?:  (UTA) Suicidal Plan?:  (UTA) Has patient had any suicidal plan within the past 6 months prior to admission? :  (UTA) Access to Means:  (UTA) What has been your use of drugs/alcohol within the last 12 months?:  (UTA) Previous Attempts/Gestures:  (UTA) How many times?:  (UTA) Other Self Harm Risks:  (UTA) Triggers for Past Attempts:  (UTA) Intentional Self Injurious Behavior:  (UTA) Family Suicide History:  (UTA) Recent stressful life event(s):  (UTA) Persecutory voices/beliefs?:  (UTA) Depression:  (UTA) Depression Symptoms:  (UTA) Substance abuse history and/or treatment for substance abuse?:  (UTA) Suicide  prevention information given to non-admitted patients:  (UTA)  Risk to Others within the past 6 months Homicidal Ideation:  (UTA) Does patient have any lifetime risk of violence toward others beyond the six months prior to admission? :  (UTA) Thoughts of Harm to Others:  (UTA) Current Homicidal Intent:  (UTA) Current Homicidal Plan:   (UTA) Access to Homicidal Means:  (UTA) Identified Victim:  (UTA) History of harm to others?:  (UTA) Assessment of Violence:  (UTA) Violent Behavior Description:  (UTA) Does patient have access to weapons?:  (UTA) Criminal Charges Pending?:  (UTA) Does patient have a court date:  (UTA) Is patient on probation?:  (UTA)  Psychosis Hallucinations:  (UTA) Delusions:  (UTA)  Mental Status Report Appearance/Hygiene: Unremarkable Eye Contact: Unable to Assess Motor Activity: Restlessness Speech: Word salad Level of Consciousness: Unable to assess Thought Processes: Unable to Assess Judgement: Unable to Assess Orientation: Unable to assess Obsessive Compulsive Thoughts/Behaviors: Unable to Assess  Cognitive Functioning Concentration: Unable to Assess Memory: Unable to Assess Insight: Unable to Assess Impulse Control: Unable to Assess Sleep: Unable to Assess Total Hours of Sleep:  (UTA)  ADLScreening [facility] Assessment Services) Patient's cognitive ability adequate to safely complete daily activities?: Yes Patient able to express need for assistance with ADLs?:  (UTA) Independently performs ADLs?:  (UTA)  Prior Inpatient Therapy Prior Inpatient Therapy:  (UTA) Prior Therapy Dates:  (UTA) Prior Therapy Facilty/Provider(s):  (UTA) Reason for Treatment:  (UTA)  Prior Outpatient Therapy Prior Outpatient Therapy:  (UTA) Prior Therapy Dates:  (UTA) Prior Therapy Facilty/Provider(s):  (UTA) Reason for Treatment:  (UTA) Does patient have an ACCT team?:  (UTA) Does patient have Intensive In-House Services?  : Unknown Does patient have [facility] services? : Unknown Does patient have P4CC services?: Unknown  ADL Screening (condition at time of admission) Patient's cognitive ability adequate to safely complete daily activities?: Yes Is the patient deaf or have difficulty hearing?:  (UTA) Does the patient have difficulty seeing, even when wearing glasses/contacts?:  (UTA) Does the  patient have difficulty concentrating, remembering, or making decisions?:  (UTA) Patient able to express need for assistance with ADLs?:  (UTA) Does the patient have difficulty dressing or bathing?:  (UTA) Independently performs ADLs?:  (UTA) Does the patient have difficulty walking or climbing stairs?:  (UTA)  Home Assistive Devices/Equipment Home Assistive Devices/Equipment:  (UTA)    Abuse/Neglect Assessment (Assessment to be complete while patient is alone) Physical Abuse:  (UTA) Verbal Abuse:  (UTA) Sexual Abuse:  (UTA) Exploitation of patient/patient's resources:  (UTA) Self-Neglect:  (UTA) Values / Beliefs Cultural Requests During Hospitalization:  (UTA) Spiritual Requests During Hospitalization:  (UTA)   Advance Directives (For Healthcare) Does patient have an advance directive?:  (UTA due to patient mental state) Would patient like information on creating an advanced directive?:  (UTA due to patient mental state)          Disposition:  Disposition Initial Assessment Completed for this Encounter: Yes Disposition of Patient: Inpatient treatment program (per Maryjean Morn, PA-C) Type of inpatient treatment program: Adult  On Site Evaluation by:   Reviewed with Physician:    Bryan Tran 08/08/2015 6:27 AM

## 2015-08-08 NOTE — BH Assessment (Signed)
Assessment completed.  Consulted with Maryjean Mornharles Kober, PA-C who recommends inpatient treatment at this time.   Bryan PokeJoVea Elanie Hammitt, LCSW Therapeutic Triage Specialist [facility] 08/08/2015 4:20 AM

## 2015-08-08 NOTE — ED Notes (Signed)
Report received from Diane RN. Patient alert, warm and dry, in no acute distress. Patient denies SI, HI, AVH and pain. Patient made aware of Q15 minute rounds and security cameras for their safety. Patient instructed to come to me with needs or concerns.

## 2015-08-08 NOTE — ED Notes (Signed)
Pt. States he is leaving at 2100 with or without our help.

## 2015-08-08 NOTE — ED Notes (Signed)
Patient noted in room. Pt. Placed chair and overbed table in front of door. Q15 minute rounds and monitoring via [employer] Cameras to continue.

## 2015-08-09 ENCOUNTER — Inpatient Hospital Stay (HOSPITAL_COMMUNITY)
Admission: AD | Admit: 2015-08-09 | Discharge: 2015-08-22 | DRG: 885 | Disposition: A | Discharge status: Home / Self Care | Payer: Medicaid Other | Source: Intra-hospital | Attending: Psychiatry | Admitting: Psychiatry

## 2015-08-09 ENCOUNTER — Encounter (HOSPITAL_COMMUNITY): Payer: Self-pay

## 2015-08-09 DIAGNOSIS — F1220 Cannabis dependence, uncomplicated: Secondary | ICD-10-CM | POA: Diagnosis not present

## 2015-08-09 DIAGNOSIS — Z8249 Family history of ischemic heart disease and other diseases of the circulatory system: Secondary | ICD-10-CM

## 2015-08-09 DIAGNOSIS — Z9119 Patient's noncompliance with other medical treatment and regimen: Secondary | ICD-10-CM

## 2015-08-09 DIAGNOSIS — F429 Obsessive-compulsive disorder, unspecified: Secondary | ICD-10-CM | POA: Diagnosis present

## 2015-08-09 DIAGNOSIS — F17210 Nicotine dependence, cigarettes, uncomplicated: Secondary | ICD-10-CM | POA: Diagnosis present

## 2015-08-09 DIAGNOSIS — F312 Bipolar disorder, current episode manic severe with psychotic features: Secondary | ICD-10-CM | POA: Diagnosis not present

## 2015-08-09 DIAGNOSIS — Z818 Family history of other mental and behavioral disorders: Secondary | ICD-10-CM | POA: Diagnosis not present

## 2015-08-09 DIAGNOSIS — F319 Bipolar disorder, unspecified: Secondary | ICD-10-CM | POA: Diagnosis not present

## 2015-08-09 DIAGNOSIS — G4700 Insomnia, unspecified: Secondary | ICD-10-CM | POA: Diagnosis present

## 2015-08-09 DIAGNOSIS — F3110 Bipolar disorder, current episode manic without psychotic features, unspecified: Secondary | ICD-10-CM | POA: Diagnosis not present

## 2015-08-09 MED ORDER — HALOPERIDOL LACTATE 5 MG/ML IJ SOLN
INTRAMUSCULAR | Status: AC
  Filled 2015-08-09: qty 1

## 2015-08-09 MED ORDER — HALOPERIDOL LACTATE 5 MG/ML IJ SOLN
5.0000 mg | Freq: Once | INTRAMUSCULAR | Status: AC
  Administered 2015-08-09: 5 mg via INTRAMUSCULAR

## 2015-08-09 MED ORDER — NICOTINE POLACRILEX 2 MG MT GUM
2.0000 mg | CHEWING_GUM | OROMUCOSAL | Status: DC | PRN
  Administered 2015-08-09 (×3): 2 mg via ORAL
  Filled 2015-08-09 (×4): qty 1

## 2015-08-09 MED ORDER — OLANZAPINE 10 MG PO TBDP
10.0000 mg | ORAL_TABLET | Freq: Three times a day (TID) | ORAL | Status: DC | PRN
  Administered 2015-08-09: 10 mg via ORAL
  Filled 2015-08-09: qty 1

## 2015-08-09 MED ORDER — HYDROXYZINE HCL 25 MG PO TABS
25.0000 mg | ORAL_TABLET | Freq: Two times a day (BID) | ORAL | Status: DC
  Administered 2015-08-10 – 2015-08-22 (×21): 25 mg via ORAL
  Filled 2015-08-09 (×31): qty 1

## 2015-08-09 MED ORDER — HALOPERIDOL LACTATE 5 MG/ML IJ SOLN
2.0000 mg | Freq: Once | INTRAMUSCULAR | Status: AC
  Administered 2015-08-09: 2 mg via INTRAMUSCULAR
  Filled 2015-08-09: qty 1

## 2015-08-09 MED ORDER — HALOPERIDOL 2 MG PO TABS
2.0000 mg | ORAL_TABLET | Freq: Two times a day (BID) | ORAL | Status: DC
  Administered 2015-08-10: 2 mg via ORAL
  Filled 2015-08-09 (×3): qty 1

## 2015-08-09 MED ORDER — MAGNESIUM HYDROXIDE 400 MG/5ML PO SUSP: 30.0000 mL | Freq: Every day | ORAL | Status: DC | PRN

## 2015-08-09 MED ORDER — ACETAMINOPHEN 325 MG PO TABS: 650.0000 mg | ORAL_TABLET | Freq: Four times a day (QID) | ORAL | Status: DC | PRN

## 2015-08-09 MED ORDER — OXCARBAZEPINE 300 MG PO TABS
300.0000 mg | ORAL_TABLET | Freq: Two times a day (BID) | ORAL | Status: DC
  Administered 2015-08-09 – 2015-08-10 (×2): 300 mg via ORAL
  Filled 2015-08-09 (×5): qty 1

## 2015-08-09 MED ORDER — NICOTINE POLACRILEX 2 MG MT GUM
2.0000 mg | CHEWING_GUM | OROMUCOSAL | Status: DC | PRN
  Administered 2015-08-10 – 2015-08-13 (×4): 2 mg via ORAL

## 2015-08-09 MED ORDER — ALUM & MAG HYDROXIDE-SIMETH 200-200-20 MG/5ML PO SUSP: 30.0000 mL | ORAL | Status: DC | PRN

## 2015-08-09 MED ORDER — HALOPERIDOL LACTATE 5 MG/ML IJ SOLN: 5.0000 mg | Freq: Once | INTRAMUSCULAR | Status: DC

## 2015-08-09 MED ORDER — TRAZODONE HCL 150 MG PO TABS
150.0000 mg | ORAL_TABLET | Freq: Every day | ORAL | Status: DC
  Administered 2015-08-09: 150 mg via ORAL
  Filled 2015-08-09 (×17): qty 1

## 2015-08-09 MED ORDER — ACETAMINOPHEN 325 MG PO TABS
650.0000 mg | ORAL_TABLET | ORAL | Status: DC | PRN
  Administered 2015-08-16 – 2015-08-21 (×3): 650 mg via ORAL
  Filled 2015-08-09 (×3): qty 2

## 2015-08-09 NOTE — ED Notes (Signed)
Patient noted in hall. No complaints, stable, in no acute distress. Q15 minute rounds and monitoring via Security Cameras to continue. 

## 2015-08-09 NOTE — Progress Notes (Addendum)
Pt is in the hallway sitting on a counter talking to himself with what appears to be word salad. Pt is pointing his finger at the writer and all of his words are running together. He does appear to be responding to internal stimuli. Phoned EDP concerning pt stating that he would not take pills.He stated, "I will spit them in the trash." ED will order haldol IM. Pt given haldol IM with security at the bedside. Pt threw his pills on the floor. Pt continues to pace in the hallway. (8;15am ) He at times is paranoid and insisted on seeing the packaging from the nicorette gum. Pt does appear calmer( 8:25am ) [facility]. Pt given 10mg  of zyprexa for agitation. 11am . 1pm -Pt is very agitated and insisting he has to leave for work. 3pm -Report to the oncoming shift.

## 2015-08-09 NOTE — Consult Note (Signed)
[place] Psychiat[facility] Consult   Reason for Consult:  Hallucinations  Referring Physician:  EDP Patient Identification: Bryan Tran MRN:  [id number] Principal Diagnosis: Bipolar I disorder, most recent episode (or current) manic ([place]) Diagnosis:   Patient Active Problem List   Diagnosis Date Noted  . Cannabis use disorder, severe, dependence ([place]) [F12.20] 08/08/2015    Priority: High  . Bipolar I disorder, most recent episode (or current) manic ([place]) [F31.10] 09/10/2013    Priority: High  . Marijuana dependence ([place]) [F12.20] 09/07/2013  . Unspecified psychosis [F29] 09/06/2013  . Psychosis [F29] 09/06/2013    Total Time spent with patient: 30 minutes  Subjective:   Bryan Tran is a 20 y.o. male patient admitted with mania.  HPI:   On admission: 20 yo male who presented to the ED with mania.  He was restless earlier and delusional thinking he was from [place] with an accent.  Pleasant and took Geodon IM willingly.  He uses marijuana daily, large amounts.  Denies suicidal/homicidal ideations and alcohol abuse.  He is tangential with increase in energy, admission needed.  Today:  Patient is calmer but hypomanic at this time.  Pleasant and singing at times, jovial.  Ready to go to inpatient, compliant with his medications.    Past Psychiatric History: bipolar disorder, marijuana abuse  Risk to Self: Suicidal Ideation:  (UTA) Suicidal Intent:  (UTA) Is patient at risk for suicide?:  (UTA) Suicidal Plan?:  (UTA) Access to Means:  (UTA) What has been your use of drugs/alcohol within the last 12 months?:  (UTA) How many times?:  (UTA) Other Self Harm Risks:  (UTA) Triggers for Past Attempts:  (UTA) Intentional Self Injurious Behavior:  (UTA) Risk to Others: Homicidal Ideation:  (UTA) Thoughts of Harm to Others:  (UTA) Current Homicidal Intent:  (UTA) Current Homicidal Plan:  (UTA) Access to Homicidal Means:  (UTA) Identified Victim:  (UTA) History of harm to  others?:  (UTA) Assessment of Violence:  (UTA) Violent Behavior Description:  (UTA) Does patient have access to weapons?:  (UTA) Criminal Charges Pending?:  (UTA) Does patient have a court date:  Special educational needs teacher) Prior Inpatient Therapy: Prior Inpatient Therapy:  (UTA) Prior Therapy Dates:  (UTA) Prior Therapy Facilty/Provider(s):  (UTA) Reason for Treatment:  (UTA) Prior Outpatient Therapy: Prior Outpatient Therapy:  (UTA) Prior Therapy Dates:  (UTA) Prior Therapy Facilty/Provider(s):  (UTA) Reason for Treatment:  (UTA) Does patient have an ACCT team?:  (UTA) Does patient have Intensive In-House Services?  : Unknown Does patient have [facility] services? : Unknown Does patient have P4CC services?: Unknown  Past Medical History:  Past Medical History  Diagnosis Date  . Bipolar I disorder (HCC) manic  . Psychosis   . Marihuana dependence ([place])    History reviewed. No pertinent past surgical history. Family History: History reviewed. No pertinent family history. Family Psychiatric  History: none Social History:  History  Alcohol Use No     History  Drug Use  . Yes  . Special: Marijuana    Social History   Social History  . Marital Status: Single    Spouse Name: N/A  . Number of Children: N/A  . Years of Education: N/A   Social History Main Topics  . Smoking status: Never Smoker   . Smokeless tobacco: None  . Alcohol Use: No  . Drug Use: Yes    Special: Marijuana  . Sexual Activity: Not Asked   Other Topics Concern  . None   Social History Narrative   Additional  Social History:    Allergies:   Allergies  Allergen Reactions  . Risperdal [Risperidone]     Patient notes that he complained of "tongue getting thick and having difficulty speaking."    Labs:  Results for orders placed or performed during the hospital encounter of 08/07/15 (from the past 48 hour(s))  CBC with Differential     Status: Abnormal   Collection Time: 08/08/15  1:37 AM  Result Value Ref Range    WBC 11.1 (H) 4.0 - 10.5 K/uL   RBC 4.82 4.22 - 5.81 MIL/uL   Hemoglobin 14.5 13.0 - 17.0 g/dL   HCT 42.2 39.0 - 52.0 %   MCV 87.6 78.0 - 100.0 fL   MCH 30.1 26.0 - 34.0 pg   MCHC 34.4 30.0 - 36.0 g/dL   RDW 12.8 11.5 - 15.5 %   Platelets 240 150 - 400 K/uL   Neutrophils Relative % 74 %   Lymphocytes Relative 19 %   Monocytes Relative 6 %   Eosinophils Relative 0 %   Basophils Relative 1 %   Neutro Abs 8.2 (H) 1.7 - 7.7 K/uL   Lymphs Abs 2.1 0.7 - 4.0 K/uL   Monocytes Absolute 0.7 0.1 - 1.0 K/uL   Eosinophils Absolute 0.0 0.0 - 0.7 K/uL   Basophils Absolute 0.1 0.0 - 0.1 K/uL   WBC Morphology ATYPICAL LYMPHOCYTES   Comprehensive metabolic panel     Status: Abnormal   Collection Time: 08/08/15  1:37 AM  Result Value Ref Range   Sodium 137 135 - 145 mmol/L   Potassium 3.3 (L) 3.5 - 5.1 mmol/L   Chloride 102 101 - 111 mmol/L   CO2 23 22 - 32 mmol/L   Glucose, Bld 82 65 - 99 mg/dL   BUN 17 6 - 20 mg/dL   Creatinine, Ser 1.07 0.61 - 1.24 mg/dL   Calcium 9.3 8.9 - 10.3 mg/dL   Total Protein 7.6 6.5 - 8.1 g/dL   Albumin 4.8 3.5 - 5.0 g/dL   AST 29 15 - 41 U/L   ALT 23 17 - 63 U/L   Alkaline Phosphatase 57 38 - 126 U/L   Total Bilirubin 1.5 (H) 0.3 - 1.2 mg/dL   GFR calc non Af Amer >60 >60 mL/min   GFR calc Af Amer >60 >60 mL/min    Comment: (NOTE) The eGFR has been calculated using the CKD EPI equation. This calculation has not been validated in all clinical situations. eGFR's persistently <60 mL/min signify possible Chronic Kidney Disease.    Anion gap 12 5 - 15  Ethanol     Status: None   Collection Time: 08/08/15  1:37 AM  Result Value Ref Range   Alcohol, Ethyl (B) <5 <5 mg/dL    Comment:        LOWEST DETECTABLE LIMIT FOR SERUM ALCOHOL IS 5 mg/dL FOR MEDICAL PURPOSES ONLY   Salicylate level     Status: None   Collection Time: 08/08/15  1:37 AM  Result Value Ref Range   Salicylate Lvl <4.1 2.8 - 30.0 mg/dL  Acetaminophen level     Status: Abnormal    Collection Time: 08/08/15  1:37 AM  Result Value Ref Range   Acetaminophen (Tylenol), Serum <10 (L) 10 - 30 ug/mL    Comment:        THERAPEUTIC CONCENTRATIONS VARY SIGNIFICANTLY. A RANGE OF 10-30 ug/mL MAY BE AN EFFECTIVE CONCENTRATION FOR MANY PATIENTS. HOWEVER, SOME ARE BEST TREATED AT CONCENTRATIONS OUTSIDE THIS RANGE. ACETAMINOPHEN CONCENTRATIONS >150  ug/mL AT 4 HOURS AFTER INGESTION AND >50 ug/mL AT 12 HOURS AFTER INGESTION ARE OFTEN ASSOCIATED WITH TOXIC REACTIONS.     Current Facility-Administered Medications  Medication Dose Route Frequency Provider Last Rate Last Dose  . acetaminophen (TYLENOL) tablet 650 mg  650 mg Oral Q4H PRN Wandra Arthurs, MD      . haloperidol (HALDOL) tablet 2 mg  2 mg Oral BID Corena Pilgrim, MD   2 mg at 08/08/15 1346  . haloperidol lactate (HALDOL) injection 5 mg  5 mg Intramuscular Once Dara Hoyer, PA-C   Stopped at 08/09/15 9449  . hydrOXYzine (ATARAX/VISTARIL) tablet 25 mg  25 mg Oral BID PC Corena Pilgrim, MD   25 mg at 08/08/15 1805  . nicotine polacrilex (NICORETTE) gum 2 mg  2 mg Oral PRN Dara Hoyer, PA-C   2 mg at 08/09/15 1036  . OLANZapine zydis (ZYPREXA) disintegrating tablet 10 mg  10 mg Oral Q8H PRN Corena Pilgrim, MD   10 mg at 08/09/15 1036  . Oxcarbazepine (TRILEPTAL) tablet 300 mg  300 mg Oral BID Corena Pilgrim, MD   300 mg at 08/08/15 1346  . traZODone (DESYREL) tablet 150 mg  150 mg Oral QHS Corena Pilgrim, MD   150 mg at 08/08/15 2117   Current Outpatient Prescriptions  Medication Sig Dispense Refill  . ARIPiprazole (ABILIFY) 15 MG tablet Take 1 tablet (15 mg total) by mouth daily. (Patient not taking: Reported on 08/08/2015) 30 tablet 0  . ARIPiprazole 400 MG SUSR Inject 400 mg into the muscle every 28 (twenty-eight) days. Next dose due 10/10/13. (Patient not taking: Reported on 08/08/2015) 1 each 3  . carbamazepine (EQUETRO) 200 MG CP12 12 hr capsule Take 1 capsule (200 mg total) by mouth 2 (two) times daily.  (Patient not taking: Reported on 08/08/2015) 60 each 0  . traZODone (DESYREL) 150 MG tablet Take 1 tablet (150 mg total) by mouth at bedtime. (Patient not taking: Reported on 08/08/2015) 30 tablet 0    Musculoskeletal: Strength & Muscle Tone: within normal limits Gait & Station: normal Patient leans: N/A  Psychiatric Specialty Exam: Physical Exam  Constitutional: He is oriented to person, place, and time. He appears well-developed and well-nourished.  HENT:  Head: Normocephalic.  Neck: Normal range of motion.  Respiratory: Effort normal.  GI: Soft.  Musculoskeletal: Normal range of motion.  Neurological: He is alert and oriented to person, place, and time.  Skin: Skin is warm and dry.  Psychiatric: His speech is normal. His mood appears anxious. He is actively hallucinating. Thought content is delusional. Cognition and memory are impaired. He expresses impulsivity.    Review of Systems  Constitutional: Negative.   HENT: Negative.   Eyes: Negative.   Respiratory: Negative.   Cardiovascular: Negative.   Gastrointestinal: Negative.   Genitourinary: Negative.   Musculoskeletal: Negative.   Skin: Negative.   Neurological: Negative.   Endo/Heme/Allergies: Negative.   Psychiatric/Behavioral: Positive for hallucinations and substance abuse.    Blood pressure 137/98, pulse 89, temperature 98.5 F (36.9 C), temperature source Oral, resp. rate 16, height 6' (1.829 m), weight 72.576 kg (160 lb), SpO2 100 %.Body mass index is 21.7 kg/(m^2).  General Appearance: Casual  Eye Contact:  Good  Speech:  Pressured  Volume:  Normal  Mood:  Anxious and Euphoric  Affect:  Congruent  Thought Process:  Descriptions of Associations: Tangential  Orientation:  Full (Time, Place, and Person)  Thought Content:  Delusions and Hallucinations: Auditory  Suicidal Thoughts:  No  Homicidal Thoughts:  No  Memory:  Immediate;   Fair Recent;   Poor Remote;   Good  Judgement:  Impaired  Insight:  Lacking   Psychomotor Activity:  Increased  Concentration:  Concentration: Poor and Attention Span: Poor  Recall:  [place] of Knowledge:  Fair  Language:  Good  Akathisia:  No  Handed:  Right  AIMS (if indicated):     Assets:  Leisure Time Physical Health Resilience Social Support  ADL's:  Intact  Cognition:  Impaired,  Mild  Sleep:        Treatment Plan Summary: Daily contact with patient to assess and evaluate symptoms and progress in treatment, Medication management and Plan bipolar affective disorder, mania, severe with psychosis:  -Crisis stabilization -Medication management:  Continue Haldol 2 mg BID for psychosis, Vistaril 25 mg BID for anxiety, Zyprexa 10 mg every 8 hours PRN agitation, Trazodone 150 mg at bedtime for sleep, and Trileptal 300 mg BID for mood stabilization. -Individual and substance counseling  Disposition: Recommend psychiatric Inpatient admission when medically cleared.  Waylan Boga, NP 08/09/2015 11:46 AM   Patient seen face-to-face for psychiatric evaluation, chart reviewed and case discussed with the physician extender and developed treatment plan. Reviewed the information documented and agree with the treatment plan. Corena Pilgrim, MD

## 2015-08-09 NOTE — ED Notes (Signed)
Patient noted in hall responding to internal stimuli. No complaints, stable, in no acute distress. Q15 minute rounds and monitoring via Security Cameras to continue.  

## 2015-08-09 NOTE — Progress Notes (Signed)
Patient ID: Bryan Tran, male   DOB: 1996/01/21, 20 y.o.   MRN: [id number]  Admission Note:  D:20 yr male who presents IVC in no acute distress for the treatment of HI and Psychosis. Pt presents flat and depressed, with word salad and irritable. Pt was calm and cooperative with admission process. Pt has loose associations, pt forwards little coherent information, but pt did state he was living with his brother before there was a situation that pt would not fully disclose, but [facility]. Pt presents disorganized at times, but pt is redirectable for Clinical research associatewriter. Pt stated he would [facility], and do what ever we ask.   A:Skin was assessed and found to be clear of any abnormal marks apart from a scar on R knee. PT searched and no contraband found, POC and unit policies explained and understanding verbalized. Consents obtained. Food and fluids offered, and fluids accepted.  R: Pt had no additional questions or concerns.

## 2015-08-09 NOTE — ED Notes (Signed)
Patient has refused his 5pm medication and is becoming more agitated. Speaking in an demeaning manner to staff, refusing to leave the nurses station and beginning to raise his voice, . IM haldol order received. Patient was cooperative with the injection. [facility]. [facility]  VS WNL. [facility] called to transport patient, as her is IVC'd. Patient left unit with [facility] at 6:45pm. All personal effects,  IVC paper work and EMTALA form sent with him.

## 2015-08-09 NOTE — ED Notes (Signed)
Patient noted walking in hall. No complaints, stable, in no acute distress. Q15 minute rounds and monitoring via Security Cameras to continue. 

## 2015-08-09 NOTE — Tx Team (Signed)
Initial Interdisciplinary Treatment Plan   PATIENT STRESSORS: Marital or family conflict Medication change or noncompliance Substance abuse   PATIENT STRENGTHS: General fund of knowledge Physical Health   PROBLEM LIST: Problem List/Patient Goals Date to be addressed Date deferred Reason deferred Estimated date of resolution  psychosis 08/09/15     Medication non-compliance 08/09/15     aggression 08/09/15     "get better" 08/09/15                                     DISCHARGE CRITERIA:  Improved stabilization in mood, thinking, and/or behavior Verbal commitment to aftercare and medication compliance  PRELIMINARY DISCHARGE PLAN: Attend PHP/IOP Outpatient therapy  PATIENT/FAMIILY INVOLVEMENT: This treatment plan has been presented to and reviewed with the patient, Candy Sledgemmanuel Rivard.  The patient and family have been given the opportunity to ask questions and make suggestions.  Jacques Navyhillips, Leisel Pinette A 08/09/2015, 11:44 PM

## 2015-08-09 NOTE — ED Notes (Addendum)
Patient has been up at the nurses station often. Patient redirected and was appropriate. Patient's conversation has been tangential and disorganized. Dancing in the hall. Refused 5pm medication. Looking up at the ceiling and talking to someone, "I stopped lawyer school."

## 2015-08-09 NOTE — ED Provider Notes (Signed)
Patient is pacing about the hallway, agitated refusing take oral medication. IM Haldol ordered  Doug SouSam Terek Bee, MD 08/09/15 1757

## 2015-08-09 NOTE — ED Notes (Signed)
Patient noted sleeping in room. No complaints, stable, in no acute distress. Q15 minute rounds and monitoring via Security Cameras to continue.  

## 2015-08-09 NOTE — ED Notes (Signed)
Patient noted in hall, dancing and singing while responding to internal stimuli. No complaints, stable, in no acute distress. Q15 minute rounds and monitoring via [employer] Cam[employer] to continue.

## 2015-08-10 ENCOUNTER — Encounter (HOSPITAL_COMMUNITY): Payer: Self-pay | Admitting: Psychiatry

## 2015-08-10 DIAGNOSIS — F312 Bipolar disorder, current episode manic severe with psychotic features: Principal | ICD-10-CM | POA: Diagnosis present

## 2015-08-10 DIAGNOSIS — F1220 Cannabis dependence, uncomplicated: Secondary | ICD-10-CM

## 2015-08-10 LAB — RAPID URINE DRUG SCREEN, HOSP PERFORMED
AMPHETAMINES: NOT DETECTED
BARBITURATES: NOT DETECTED
BENZODIAZEPINES: NOT DETECTED
Cocaine: NOT DETECTED
Opiates: NOT DETECTED
Tetrahydrocannabinol: POSITIVE — AB

## 2015-08-10 MED ORDER — POTASSIUM CHLORIDE CRYS ER 20 MEQ PO TBCR
20.0000 meq | EXTENDED_RELEASE_TABLET | Freq: Two times a day (BID) | ORAL | Status: AC
  Administered 2015-08-12: 20 meq via ORAL
  Filled 2015-08-10 (×4): qty 1

## 2015-08-10 MED ORDER — OLANZAPINE 10 MG PO TBDP
10.0000 mg | ORAL_TABLET | Freq: Three times a day (TID) | ORAL | Status: DC | PRN
  Filled 2015-08-10: qty 1

## 2015-08-10 MED ORDER — OLANZAPINE 10 MG IM SOLR: 10.0000 mg | Freq: Three times a day (TID) | INTRAMUSCULAR | Status: DC | PRN

## 2015-08-10 MED ORDER — LORAZEPAM 1 MG PO TABS: 1.0000 mg | ORAL_TABLET | Freq: Four times a day (QID) | ORAL | Status: DC | PRN

## 2015-08-10 MED ORDER — BENZTROPINE MESYLATE 0.5 MG PO TABS
0.5000 mg | ORAL_TABLET | Freq: Two times a day (BID) | ORAL | Status: DC
  Administered 2015-08-12 – 2015-08-22 (×21): 0.5 mg via ORAL
  Filled 2015-08-10 (×29): qty 1

## 2015-08-10 MED ORDER — LORAZEPAM 2 MG/ML IJ SOLN
1.0000 mg | Freq: Four times a day (QID) | INTRAMUSCULAR | Status: DC | PRN
  Administered 2015-08-11: 1 mg via INTRAMUSCULAR
  Filled 2015-08-10: qty 1

## 2015-08-10 MED ORDER — DIVALPROEX SODIUM ER 250 MG PO TB24
750.0000 mg | ORAL_TABLET | Freq: Every day | ORAL | Status: DC
  Filled 2015-08-10 (×4): qty 3

## 2015-08-10 MED ORDER — HALOPERIDOL 5 MG PO TABS
5.0000 mg | ORAL_TABLET | Freq: Two times a day (BID) | ORAL | Status: DC
  Filled 2015-08-10 (×4): qty 1

## 2015-08-10 NOTE — BHH Counselor (Signed)
Adult Comprehensive Assessment  Patient ID: Bryan Tran, male DOB: 01-14-1996, 20y.o. MRN: [id number]  Information Source:  Information source: Patient   Current Stressors:  Employment / Job issues: Currently unemployed, recently working for [employer] Family Relationships: N/A Surveyor, quantity / Lack of resources (include bankruptcy): No income Housing / Lack of housing: Stays with brother Social relationships: N/A Substance abuse: Pt reports that he is smoking 1-3 joints of marijuana daily  Bereavement / Loss: N/A  Living/Environment/Situation:  Living Arrangements: Pt currently liveswith his older brother in his apartment  Living conditions (as described by patient or guardian): stable, safe, loving How long has patient lived in current situation?: 7 months or so What is atmosphere in current home: Comfortable  Family History:  Marital status: Single  Does patient have children?: No    Childhood History:  By whom was/is the patient raised?: Mother and Father; parents are now separated, Dad moved to [facility] [employer] Description of patient's relationship with caregiver when they were a child: Pt reports that growing up in his home was "A-Ok", but did not disclose any further detail Patient's description of current relationship with people who raised him/her: Pt reports that he has a good relationship with his mom and dad  Does patient have siblings?: Yes  Number of Siblings: 2 Description of patient's current relationship with siblings: Pt reports a positive relationship with his two siblings, verbalizes that they get along well Did patient suffer any verbal/emotional/physical/sexual abuse as a child?: Pt denies Did patient suffer from severe childhood neglect?: No  Has patient ever been sexually abused/assaulted/raped as an adolescent or adult?: No  Was the patient ever a victim of a crime or a disaster?: No  Witnessed domestic violence?: No  Has  patient been effected by domestic violence as an adult?: No   Education:  Highest grade of school patient has completed: 12th grade; graduated in June 2015 Currently a Consulting civil engineer?: No  Learning disability?: No   Employment/Work Situation:  Employment situation: Currently Unemployed; since last admission worked at [employer] firm and at [employer] Patient's job has been impacted by current illness: Yes How?:  Was not taking meds-became increasingly symptomatic  What is the longest time patient has a held a job?: a year and a few months Where was the patient employed at that time?: [employer], a Research officer, political party  Has patient ever been in the [employer]?: No  Has patient ever served in combat?: No   Financial Resources:  Financial resources: No income; Pt supported by family Does patient have a representative payee or guardian?: No   Alcohol/Substance Abuse:  What has been your use of drugs/alcohol within the last 12 months?: Pt reports that he has smoked marijuana daily since the age of 24; 1-3 joints daily If attempted suicide, did drugs/alcohol play a role in this?: (N/A)  Alcohol/Substance Abuse Treatment Hx:Pt denies any treatment for substance abuse Has alcohol/substance abuse ever caused legal problems?: No   Social Support System:  Patient's Community Support System: Good Describe Community Support System: Pt reports have many friends and supportive family  Type of faith/religion: "Teacher, adult education"- "smoking week and chilling is really all there is to it; they worship another God than the one everyone talks about." How does patient's faith help to cope with current illness?: Pt unable to state, "it just does, it's my history"  Leisure/Recreation:  Leisure and Hobbies: making music   Strengths/Needs:  What things does the patient do well?: making music, "you might as well ask  me what I don't do well, because I'm great at everything" In what areas does patient  struggle / problems for patient: Pt denies any areas in which he struggles  Discharge Plan:  Does patient have access to transportation?: Yes  Will patient be returning to same living situation after discharge?: Yes  Plan for living situation after discharge: Pt plans to live with a his brother  Currently receiving community mental health services: No  If no, would patient like referral for services when discharged?: Pt demonstrates limited insight, does not realize why he is in the hospital or why he would need outpatient services Does patient have financial barriers related to discharge medications?: Yes  No income, no insurance  Summary/Recommendations:  Patient is an 20 y.o. African-American male diagnosed with Bipolar D/O and Cannabis use, severe. His brother brought him to the hospital due to bizarre, disorganized thinking and behavior.  He had recently stopped taking medication and lost his job.  [name] was hospitalized here 2 years ago after a previous [facility]. Per pt's mother, pt began exhibiting bizarre behavior in June 2015 - stating he was God, a rapper, [name], stating he had an imaginary friend. He does report daily use of marijuana. Pt preoccupied with smoking marijuana, describing it as a strength and a hobby. Pt has no insight into his substance use or mental illness. Patient will benefit from crisis stabilization, medication evaluation, group therapy and psycho education in addition to case management for discharge planning.     Daryel GeraldNorth, Juanita Devincent

## 2015-08-10 NOTE — Progress Notes (Signed)
D: Patient denies SI/HI and A/V hallucinations; patient talks about being and attorney and knowing the law; patient talks about a brother that has passed away; patient talks about being a rapper; patient talks about being on herbs that he is not going to take the medicine; patient talks about his tongue and having a reaction but refuses to let this Clinical research associatewriter help or another nurse; the patient stops talking with a thick tongue and when you mention medications he starts to talk like that again and curses at you  A: Monitored q 15 minutes; patient encouraged to attend groups; patient educated about medications; patient given medications per physician orders; patient encouraged to express feelings and/or concerns  R: Patient is tangential; patient is verbally aggressive; patient is disorganized;  patient constantly up and down the hall; patient needs redirection constantly; patient's interaction with staff is assertive and with his peers he is appropriate; patient was able to set goal to talk with staff 1:1 when having feelings of SI; patient is taking medications as prescribed and tolerating medications; patient was upset about not going outside

## 2015-08-10 NOTE — B[facility] Suicide Risk Assessment (Signed)
[facility]   Nursing information obtained from:    Demographic factors:    Current Mental Status:    Loss Factors:    Historical Factors:    Risk Reduction Factors:     Total Time spent with patient: 30 minutes Principal Problem: Bipolar disorder, current episode manic severe with psychotic features [facility]HCC) Diagnosis:   Patient Active Problem List   Diagnosis Date Noted  . Bipolar disorder, current episode manic severe with psychotic features (HCC) [F31.2] 08/10/2015  . Cannabis use disorder, severe, dependence (HCC) [F12.20] 08/08/2015   Subjective Data: Please see H&P.   Continued Clinical Symptoms:  Alcohol Use Disorder Identification Test Final Score (AUDIT): 0 The "Alcohol Use Disorders Identification Test", Guidelines for Use in Primary Care, Second Edition.  World Science writerHealth Organization [facility](WHO). Score between 0-7:  no or low risk or alcohol related problems. Score between 8-15:  moderate risk of alcohol related problems. Score between 16-19:  high risk of alcohol related problems. Score 20 or above:  warrants further diagnostic evaluation for alcohol dependence and treatment.   CLINICAL FACTORS:   Alcohol/Substance Abuse/Dependencies Previous Psychiatric Diagnoses and Treatments   Musculoskeletal: Strength & Muscle Tone: within normal limits Gait & Station: normal Patient leans: N/A  Psychiatric Specialty Exam: Physical Exam  Nursing note and vitals reviewed.   Review of Systems  Psychiatric/Behavioral: Positive for substance abuse.  All other systems reviewed and are negative.   Blood pressure 130/86, pulse 67, temperature 97.5 F (36.4 C), temperature source Oral, resp. rate 67, height 5\' 9"  (1.753 m), weight 68.947 kg (152 lb).Body mass index is 22.44 kg/(m^2).            Please see H&P.                                               COGNITIVE FEATURES THAT CONTRIBUTE TO RISK:  Closed-mindedness, Polarized  thinking and Thought constriction (tunnel vision)    SUICIDE RISK:   Mild:  Suicidal ideation of limited frequency, intensity, duration, and specificity.  There are no identifiable plans, no associated intent, mild dysphoria and related symptoms, good self-control (both objective and subjective assessment), few other risk factors, and identifiable protective factors, including available and accessible social support.  PLAN OF CARE: Please see H&P.   I certify that inpatient services furnished can reasonably be expected to improve the patient's condition.   Kalob Bergen, MD 08/10/2015, 11:51 AM

## 2015-08-10 NOTE — Progress Notes (Addendum)
Pt continues to refuse medications. Pt has acted appropriately on the unit. Pt was offered Depakote, but pt refused. Pt stated he was not taking anything new. Pt said he will come to us and take the Trazodone if he can not sleep. Pt observed interacting with peers on the unit. Pt continues to have delusions of wanting to be a Clinical research associateLawyer. Writer explained that pt needs to be in school if he wants to be a Clinical research associateLawyer.

## 2015-08-10 NOTE — H&P (Signed)
Psychiatric Admission Assessment Adult  Patient Identification: Bryan Tran MRN:  [id number] Date of Evaluation:  08/10/2015 Chief Complaint: Pt states " I am a " lawyerrrr', what do you want ? "   Principal Diagnosis: Bipolar disorder, current episode manic severe with psychotic features (HCC) Diagnosis:   Patient Active Problem List   Diagnosis Date Noted  . Bipolar disorder, current episode manic severe with psychotic features (HCC) [F31.2] 08/10/2015  . Cannabis use disorder, severe, dependence (HCC) [F12.20] 08/08/2015   History of Present Illness: Bryan Tran is a 49 y old AAM , who is single , unemployed , lives with his brother in [place] , who has a hx of Bipolar disorder as well as severe cannabis abuse , who presented to [facility] brought in by EMS for disorganized , manic behavior .  Patient seen and chart reviewed today .Discussed patient with treatment team. Pt today is seen as pressured , tangential , with loose speech , very disorganized , is seen as flipping from topic to topic . Pt also seen as irritable and restless often , requiring redirection during evaluation. Pt seen as aggressive often , making fun of Clinical research associate , staring angrily at [employer] when Clinical research associate attempted to evaluate him.  Pt is currently manic , hyperactive and delusional. Pt with grandiose delusions of being a defense attorney , fighting crime.  Pt reports he has been to other IP hospitals before , however is unable to give reliable hx about his illness or his medications.  Pt reports he smokes cannabis , started abusing it at the age of 56 y and he will smoke as much as he wants to daily and no one can stop him.  Pt gave consent to talk to his mother Melvyn Hommes at # noted in EHR - Per mother pt with hx of Bipolar disorder , has been to other IP hospitals in the past - atleast 4 times. Pt has been tried on medications in the past , however he is noncompliant at this time. Pt was doing well for a while . He was  working in a Engineer, materials recently , but lost it and is currently unemployed. 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 His whole idea of wanting to be an attorney could be coming from there . Pt currently stays with his brother in [place] , however , mother does not want the brother to take the responsibility of taking care of him and hence the family is going to get together to come up with a plan to support him better. Pt does have a hx of smoking cannabis heavily.   Associated Signs/Symptoms: Depression Symptoms:  psychomotor agitation, difficulty concentrating, (Hypo) Manic Symptoms:  Delusions, Distractibility, Elevated Mood, Grandiosity, Impulsivity, Irritable Mood, Labiality of Mood, Anxiety Symptoms:  restless Psychotic Symptoms:  Delusions, PTSD Symptoms: Negative Total Time spent with patient: 1 hour  Past Psychiatric History: Pt was admitted at [facility] 7/3- 09/17/2013 , [facility] - 6/10- 08/28/13. Pt denies past suicide attempts.Currently he is noncompliant on medications.  Is the patient at risk to self? Yes.    Has the patient been a risk to self in the past 6 months? Yes.    Has the patient been a risk to self within the distant past? Yes.    Is the patient a risk to others? Yes.    Has the patient been a risk  to others in the past 6 months? Yes.    Has the patient been a risk to others within the distant past? Yes.     Prior Inpatient Therapy:  see above Prior Outpatient Therapy:  see above  Alcohol Screening: 1. How often do you have a drink containing alcohol?: Never 9. Have you or someone else been injured as a result of your drinking?: No 10. Has a relative or friend or a doctor or another health worker been concerned about your drinking or suggested you cut down?: No Alcohol Use Disorder Identification Test Final Score (AUDIT): 0 Brief Intervention: AUDIT score less than  7 or less-screening does not suggest unhealthy drinking-brief intervention not indicated Substance Abuse History in the last 12 months:  Yes.   Consequences of Substance Abuse: Medical Consequences:  admissions to hospitals Previous Psychotropic Medications: Yes - risperidone ( allergic) , Abilify , equetro  Psychological Evaluations: No  Past Medical History:  Past Medical History  Diagnosis Date  . Bipolar I disorder (HCC) manic  . Psychosis   . Marihuana dependence (HCC)    History reviewed. No pertinent past surgical history. Family History:  Family History  Problem Relation Age of Onset  . Hypertension Mother   . Depression Father    Family Psychiatric  History:Per mother father has hx of depression and has attempted suicide in the past. Tobacco Screening:1/2 ppd - Offered nicotine patch Social History: Pt is single, unemployed , was able to hold a job in the past off and on, denies legal issues, [place] . History  Alcohol Use No     History  Drug Use  . Yes  . Special: Marijuana    Additional Social History:                           Allergies:   Allergies  Allergen Reactions  . Risperdal [Risperidone]     Patient notes that he complained of "tongue getting thick and having difficulty speaking."   Lab Results: No results found for this or any previous visit (from the past 48 hour(s)).  Blood Alcohol level:  Lab Results  Component Value Date   ETH <5 08/08/2015   ETH <11 09/06/2013    Metabolic Disorder Labs:  No results found for: HGBA1C, MPG No results found for: PROLACTIN No results found for: CHOL, TRIG, HDL, CHOLHDL, VLDL, LDLCALC  Current Medications: Current Facility-Administered Medications  Medication Dose Route Frequency Provider Last Rate Last Dose  . acetaminophen (TYLENOL) tablet 650 mg  650 mg Oral Q4H PRN Charm RingsJamison Y Lord, NP      . alum & mag hydroxide-simeth (MAALOX/MYLANTA) 200-200-20 MG/5ML suspension 30  mL  30 mL Oral Q4H PRN Charm RingsJamison Y Lord, NP      . benztropine (COGENTIN) tablet 0.5 mg  0.5 mg Oral BID Jomarie LongsSaramma Jamise Pentland, MD      . divalproex (DEPAKOTE ER) 24 hr tablet 750 mg  750 mg Oral QHS Toniann Dickerson, MD      . haloperidol (HALDOL) tablet 5 mg  5 mg Oral BID Jomarie LongsSaramma Mathayus Stanbery, MD      . hydrOXYzine (ATARAX/VISTARIL) tablet 25 mg  25 mg Oral BID PC Charm RingsJamison Y Lord, NP   25 mg at 08/10/15 0754  . LORazepam (ATIVAN) tablet 1 mg  1 mg Oral Q6H PRN Jomarie LongsSaramma Kashif Pooler, MD       Or  . LORazepam (ATIVAN) injection 1 mg  1 mg Intramuscular  Q6H PRN Jomarie Longs, MD      . magnesium hydroxide (MILK OF MAGNESIA) suspension 30 mL  30 mL Oral Daily PRN Charm Rings, NP      . nicotine polacrilex (NICORETTE) gum 2 mg  2 mg Oral PRN Jomarie Longs, MD   2 mg at 08/10/15 1610  . OLANZapine zydis (ZYPREXA) disintegrating tablet 10 mg  10 mg Oral TID PRN Jomarie Longs, MD       Or  . OLANZapine (ZYPREXA) injection 10 mg  10 mg Intramuscular TID PRN Jomarie Longs, MD      . potassium chloride SA (K-DUR,KLOR-CON) CR tablet 20 mEq  20 mEq Oral BID Jomarie Longs, MD      . traZODone (DESYREL) tablet 150 mg  150 mg Oral QHS Charm Rings, NP   150 mg at 08/09/15 2014   PTA Medications: Prescriptions prior to admission  Medication Sig Dispense Refill Last Dose  . ARIPiprazole (ABILIFY) 15 MG tablet Take 1 tablet (15 mg total) by mouth daily. (Patient not taking: Reported on 08/08/2015) 30 tablet 0 Unknown at Unknown time  . ARIPiprazole 400 MG SUSR Inject 400 mg into the muscle every 28 (twenty-eight) days. Next dose due 10/10/13. (Patient not taking: Reported on 08/08/2015) 1 each 3 Unknown at Unknown time  . carbamazepine (EQUETRO) 200 MG CP12 12 hr capsule Take 1 capsule (200 mg total) by mouth 2 (two) times daily. (Patient not taking: Reported on 08/08/2015) 60 each 0 Unknown at Unknown time  . traZODone (DESYREL) 150 MG tablet Take 1 tablet (150 mg total) by mouth at bedtime. (Patient not taking: Reported on  08/08/2015) 30 tablet 0 Unknown at Unknown time    Musculoskeletal: Strength & Muscle Tone: within normal limits Gait & Station: normal Patient leans: N/A  Psychiatric Specialty Exam: Physical Exam  Nursing note and vitals reviewed. Constitutional:  I concur with PE done in ED.    Review of Systems  Psychiatric/Behavioral: Positive for substance abuse.  All other systems reviewed and are negative.   Blood pressure 130/86, pulse 67, temperature 97.5 F (36.4 C), temperature source Oral, resp. rate 67, height  (1.753 m), weight 68.947 kg (152 lb).Body mass index is 22.44 kg/(m^2).  General Appearance: Guarded  Eye Contact:  Fair  Speech:  Pressured  Volume:  Increased  Mood:  Anxious and Irritable  Affect:  Labile  Thought Process:  Disorganized, Irrelevant and Descriptions of Associations: Loose  Orientation:  Full (Time, Place, and Person)  Thought Content:  Delusions and Tangential  Suicidal Thoughts:  No  Homicidal Thoughts:  No  Memory:  Immediate;   Fair Recent;   Fair Remote;   Poor  Judgement:  Impaired  Insight:  Shallow  Psychomotor Activity:  Increased and Restlessness  Concentration:  Concentration: Poor and Attention Span: Poor  Recall:  [employer] of Knowledge:  Fair  Language:  Fair  Akathisia:  No  Handed:  Right  AIMS (if indicated):     Assets:  Physical Health Social Support  ADL's:  Intact  Cognition:  WNL  Sleep:  Number of Hours: 6.75       Treatment Plan Summary: Tristyn Demarest is a 21 y old AAM , who is single , unemployed , lives with his brother in [place] , who has a hx of Bipolar disorder as well as severe cannabis abuse , who presented to [facility] brought in by EMS for disorganized , manic behavior .Pt today continues to be disorganized and delusional  as well as manic , will continue treatment.   Daily contact with patient to assess and evaluate symptoms and progress in treatment and Medication management   Patient will benefit from  inpatient treatment and stabilization.  Estimated length of stay is 5-7 days.  Reviewed past medical records,treatment plan.  Will increase Haldol to 5 mg po bid for psychosis. Will add Cogentin 0.5 mg po bid for EPS. Will add Depakote ER 750 mg po qhs for mood sx- depakote level in 5 days. Will make available PRN medications as per agitation protocol. Will continue to monitor vitals ,medication compliance and treatment side effects while patient is here.  Will monitor for medical issues as well as call consult as needed.  Reviewed labs k+ level is low - will replace with Kdur  ,will order UDS,  TSH, LIPID PANEL, HBA1C, PL as well as EKG for qtc. Collateral information was obtained from mother - sophia Dauphinais - see above for details. CSW will start working on disposition.  Patient to participate in therapeutic milieu .       Observation Level/Precautions:  15 minute checks    Psychotherapy:  Individual and group therapy     Consultations:  Social worker  Discharge Concerns:  Stability and safety       I certify that inpatient services furnished can reasonably be expected to improve the patient's condition.    Jomarie Longs, MD 6/5/201712:15 PM

## 2015-08-10 NOTE — BHH Group Notes (Signed)
BHH LCSW Group Therapy  08/10/2015 1:15 pm  Type of Therapy: Process Group Therapy  Participation Level:  Active  Participation Quality:  Appropriate  Affect:  Flat  Cognitive:  Oriented  Insight:  Improving  Engagement in Group:  Limited  Engagement in Therapy:  Limited  Modes of Intervention:  Activity, Clarification, Education, Problem-solving and Support  Summary of Progress/Problems: Today's group addressed the issue of overcoming obstacles.  Patients were asked to identify their biggest obstacle post d/c that stands in the way of their on-going success, and then problem solve as to how to manage this. In and out fo group several times.  Nothing meaningful to contribute to conversation.  Daryel Geraldorth, Adelheid Hoggard B 08/10/2015   4:50 PM

## 2015-08-11 ENCOUNTER — Encounter (HOSPITAL_COMMUNITY): Payer: Self-pay | Admitting: Psychiatry

## 2015-08-11 LAB — TSH: TSH: 0.859 u[IU]/mL (ref 0.350–4.500)

## 2015-08-11 LAB — LIPID PANEL
Cholesterol: 88 mg/dL (ref 0–200)
HDL: 39 mg/dL — AB (ref >40)
LDL Cholesterol: 35 mg/dL (ref 0–99)
Total CHOL/HDL Ratio: 2.3 RATIO
Triglycerides: 70 mg/dL (ref <150)
VLDL: 14 mg/dL (ref 0–40)

## 2015-08-11 MED ORDER — LORAZEPAM 2 MG/ML IJ SOLN
0.5000 mg | INTRAMUSCULAR | Status: DC
  Administered 2015-08-11: 0.5 mg via INTRAMUSCULAR
  Filled 2015-08-11: qty 1

## 2015-08-11 MED ORDER — LORAZEPAM 0.5 MG PO TABS
0.5000 mg | ORAL_TABLET | ORAL | Status: DC
  Administered 2015-08-12 – 2015-08-22 (×21): 0.5 mg via ORAL
  Filled 2015-08-11 (×21): qty 1

## 2015-08-11 MED ORDER — OLANZAPINE 10 MG IM SOLR
5.0000 mg | Freq: Two times a day (BID) | INTRAMUSCULAR | Status: DC
  Filled 2015-08-11 (×6): qty 10

## 2015-08-11 MED ORDER — ARIPIPRAZOLE 5 MG PO TABS
5.0000 mg | ORAL_TABLET | Freq: Two times a day (BID) | ORAL | Status: DC
  Administered 2015-08-12: 5 mg via ORAL
  Filled 2015-08-11 (×7): qty 1

## 2015-08-11 MED ORDER — LORAZEPAM 1 MG PO TABS
1.0000 mg | ORAL_TABLET | Freq: Four times a day (QID) | ORAL | Status: DC | PRN
  Administered 2015-08-14: 1 mg via ORAL
  Filled 2015-08-11: qty 1

## 2015-08-11 MED ORDER — LORAZEPAM 2 MG/ML IJ SOLN: 1.0000 mg | Freq: Four times a day (QID) | INTRAMUSCULAR | Status: DC | PRN

## 2015-08-11 NOTE — Progress Notes (Signed)
Adult Psychoeducational Group Note  Date:  08/11/2015 Time:  8:44 PM  Group Topic/Focus:  Wrap-Up Group:   The focus of this group is to help patients review their daily goal of treatment and discuss progress on daily workbooks.  Participation Level:  Did Not Attend  Participation Quality:  Did not attend  Affect:  Did not attend  Cognitive:  Did not attend  Insight: None  Engagement in Group:  Did not attend  Modes of Intervention:  Did not attend  Additional Comments:  Patient did not attend wrap up group tonight.   Takya Vandivier L Duaa Stelzner 08/11/2015, 8:44 PM

## 2015-08-11 NOTE — Tx Team (Signed)
Interdisciplinary Treatment Plan Update (Adult)  Date:  08/11/2015   Time Reviewed:  2:39 PM   Progress in Treatment: Attending groups: Intermittently Participating in groups: Inappropriately Taking medication as prescribed:  Yes. Tolerating medication:  Yes. Family/Significant other contact made:  Yes Patient understands diagnosis:  No  Limited insight Discussing patient identified problems/goals with staff:  Yes, see initial care plan. Medical problems stabilized or resolved:  Yes. Denies suicidal/homicidal ideation: Yes. Issues/concerns per patient self-inventory:  No. Other:  New problem(s) identified:  Discharge Plan or Barriers: see below  Reason for Continuation of Hospitalization: Mania Medication stabilization Other; describe Disorganization  Comments:  Patient comes from brother house. He has been rambling like this for three days. Patient has not been taking any of his medicine that he was on. Brother could not remember the medicines that patient is on. Patient has not been sleeping. Brother called EMS due to the rambling.     Pt today continues to be disorganized and delusional as well as manic  Will increase Haldol to 5 mg po bid for psychosis. Will add Cogentin 0.5 mg po bid for EPS. Will add Depakote ER 750 mg po qhs for mood sx- depakote level in 5 days.  Estimated length of stay: 4-5 days  New goal(s):  Review of initial/current patient goals per problem list:   Review of initial/current patient goals per problem list:  1. Goal(s): Patient will participate in aftercare plan   Met: Yes   Target date: 3-5 days post admission date   As evidenced by: Patient will participate within aftercare plan AEB aftercare provider and housing plan at discharge being identified. 08/11/15:  Return home, follow up outpt      6. Goal (s): Patient will demonstrate decreased signs of mania  * Met: No  * Target date: 3-5 days post admission date  * As evidenced by:  Patient demonstrate decreased signs of mania AEB decreased mood instability and return to baseline functioning 08/11/15:  Presents with pressured speech, impulsivity, intrusiveness, grandiosity and delusions.  Was not medication compliant prior to admission      Attendees: Patient:  08/11/2015 2:39 PM   Family:   08/11/2015 2:39 PM   Physician:  Saramma Eappen, MD 08/11/2015 2:39 PM   Nursing:   Elizabeth Iwenekha, RN 08/11/2015 2:39 PM   CSW:     , LCSW   08/11/2015 2:39 PM   Other:  08/11/2015 2:39 PM   Other:   08/11/2015 2:39 PM   Other:  Jennifer Clark, Nurse CM 08/11/2015 2:39 PM   Other:   08/11/2015 2:39 PM   Other:  Delora Sutton, P4CC  08/11/2015 2:39 PM   Other:  08/11/2015 2:39 PM   Other:  08/11/2015 2:39 PM   Other:  08/11/2015 2:39 PM   Other:  08/11/2015 2:39 PM   Other:  08/11/2015 2:39 PM   Other:   08/11/2015 2:39 PM    Scribe for Treatment Team:   ,  B, 08/11/2015 2:39 PM    

## 2015-08-11 NOTE — [facility] Group Notes (Signed)
[facility] LCSW Group Therapy  08/11/15   11:15 AM   Type of Therapy:  Group Therapy  Participation Level:  Minimal, came in/out of group several times  Participation Quality:  Somewhat challenging although able to be redirected  Affect:  Appropriate  Cognitive:  Alert, somewhat disorganized in his speech and thought pattern  Insight:  Minimal  Engagement in Therapy:  Minimal  Modes of Intervention:  Clarification, Confrontation, Discussion, Education, Exploration, Limit-setting, Orientation, Problem-solving, Rapport Building, Dance movement psychotherapisteality Testing, Socialization and Support  Summary of Progress/Problems: The topic for group therapy was feelings about diagnosis.  Pt actively participated in group discussion on their past and current diagnosis and how they feel towards this.  Pt also identified how society and family members judge them, based on their diagnosis as well as stereotypes and stigmas.  Patient's speech and thought patterns were disorganized and tangential.  Patient had difficulty maintaining attention to the topic of discussion, challenged facilitator wanting to know "what is your diagnosis?"  "what is your label?"  Engaged in side conversations w peer, stated he was "always happy, always up."  Appeared somewhat euphoric at times and varied between happy and somewhat irritable.  Came in/out of group several times on his own timetable.    Santa GeneraAnne Tessie Ordaz, LCSW Lead Clinical Social Worker Phone:  [phone]

## 2015-08-11 NOTE — Progress Notes (Signed)
DAR NOTE: Patient presents with irritable mood and affect. Patient continues to be hyperactive, and tangential in speech.  Patient observed responding to internal stimuli.  Patient is loud, intrusive and unwilling to participate in plan of care.  Refused all oral medications after several attempts and encouragements.   Routine safety checks maintained.  Support and encouragement offered as needed.  Patient observed socializing with peers in the dayroom.  Patient received Ativan 1 mg for severe agitation with fair effect.

## 2015-08-11 NOTE — [facility] Group Notes (Signed)
[facility] Group Notes:  (Nursing/MHT/Case Management/Adjunct)  Date:  08/11/2015  Time:  12:28 PM  Type of Therapy:  Nurse Education  Participation Level:  None  Participation Quality:  Inattentive  Affect:  Not Congruent and Resistant  Cognitive:  Disorganized and Lacking  Insight:  Lacking  Engagement in Group:  Distracting and Lacking  Modes of Intervention:  Discussion and Education  Summary of Progress/Problems: Topic was on recovery. Group discussed what recovery means to them. Group encouraged to stay focus and to surround themselves with a good support system that will be there to guide them through the recovery process. Patient was receptive and attentive.  Mickie Baillizabeth O Iwenekha 08/11/2015, 12:28 PM

## 2015-08-11 NOTE — Progress Notes (Signed)
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 What do you want.'  Objective:Bryan Tran is a 19 y old AAM , who is single , unemployed , lives with his brother in [place] , who has a hx of Bipolar disorder as well as severe cannabis abuse , who presented to [facility] brought in by EMS for disorganized , manic behavior .  Patient seen and chart reviewed.Discussed patient with treatment team.  Patient continues to be manic, has pressured speech , has loose , tangential thought process , continues to be hyperactive, labile and intrusive on the unit. Pt seen as rambling on and on - however is unable to participate in the evaluation process. Pt per staff has been refusing medications .  Pt to bee seen by Dr.Cobos for a second opinion for forced medication order.      Principal Problem: Bipolar disorder, current episode manic severe with psychotic features [facility]) Diagnosis:   Patient Active Problem List   Diagnosis Date Noted  . Bipolar disorder, current episode manic severe with psychotic features (HCC) [F31.2] 08/10/2015  . Cannabis use disorder, severe, dependence (HCC) [F12.20] 08/08/2015   Total Time spent with patient: 30 minutes  Past Psychiatric History: Pt was admitted at [facility] 7/3- 09/17/2013 , [facility] - 6/10- 08/28/13. Pt denies past suicide attempts.Currently he is noncompliant on medications  Past Medical History:  Past Medical History  Diagnosis Date  . Bipolar I disorder (HCC) manic  . Psychosis   . Marihuana dependence (HCC)    History reviewed. No pertinent past surgical history. Family History:  Family History  Problem Relation Age of Onset  . Hypertension Mother   . Depression Father    Family Psychiatric  History: Per mother father has hx of depression and has attempted suicide in the past. Social History: Pt is single, unemployed , was able to hold a job in the past  off and on, denies legal issues, lives with his brother in [place]  History  Alcohol Use No     History  Drug Use  . Yes  . Special: Marijuana    Social History   Social History  . Marital Status: Single    Spouse Name: N/A  . Number of Children: N/A  . Years of Education: N/A   Social History Main Topics  . Smoking status: Current Every Day Smoker -- 0.50 packs/day    Types: Cigarettes  . Smokeless tobacco: None  . Alcohol Use: No  . Drug Use: Yes    Special: Marijuana  . Sexual Activity: Yes    Birth Control/ Protection: None   Other Topics Concern  . None   Social History Narrative   Additional Social History:                         Sleep: does not respond  Appetite:  observed as fair  Current Medications: Current Facility-Administered Medications  Medication Dose Route Frequency Provider Last Rate Last Dose  . acetaminophen (TYLENOL) tablet 650 mg  650 mg Oral Q4H PRN Charm Rings, NP      . alum & mag hydroxide-simeth (MAALOX/MYLANTA) 200-200-20 MG/5ML suspension 30 mL  30 mL Oral Q4H PRN Charm Rings, NP      . ARIPiprazole (ABILIFY) tablet 5 mg  5 mg Oral BID Jomarie Longs, MD       Or  .  OLANZapine (ZYPREXA) injection 5 mg  5 mg Intramuscular BID Jomarie LongsSaramma Hassel Uphoff, MD      . benztropine (COGENTIN) tablet 0.5 mg  0.5 mg Oral BID Jomarie LongsSaramma Leshonda Galambos, MD   0.5 mg at 08/10/15 1700  . divalproex (DEPAKOTE ER) 24 hr tablet 750 mg  750 mg Oral QHS Shiryl Ruddy, MD   750 mg at 08/10/15 2200  . haloperidol (HALDOL) tablet 5 mg  5 mg Oral BID Jomarie LongsSaramma Margaret Staggs, MD   5 mg at 08/10/15 1700  . hydrOXYzine (ATARAX/VISTARIL) tablet 25 mg  25 mg Oral BID PC Charm RingsJamison Y Lord, NP   25 mg at 08/10/15 0754  . LORazepam (ATIVAN) tablet 0.5 mg  0.5 mg Oral BH-q12n4p Jomarie LongsSaramma Daryel Kenneth, MD       Or  . LORazepam (ATIVAN) injection 0.5 mg  0.5 mg Intramuscular BH-q12n4p Jocelyn Nold, MD      . LORazepam (ATIVAN) tablet 1 mg  1 mg Oral Q6H PRN Jomarie LongsSaramma Caidan Hubbert, MD       Or  .  LORazepam (ATIVAN) injection 1 mg  1 mg Intramuscular Q6H PRN Jaber Dunlow, MD      . magnesium hydroxide (MILK OF MAGNESIA) suspension 30 mL  30 mL Oral Daily PRN Charm RingsJamison Y Lord, NP      . nicotine polacrilex (NICORETTE) gum 2 mg  2 mg Oral PRN Jomarie LongsSaramma Exodus Kutzer, MD   2 mg at 08/10/15 2120  . potassium chloride SA (K-DUR,KLOR-CON) CR tablet 20 mEq  20 mEq Oral BID Jomarie LongsSaramma Naser Schuld, MD   20 mEq at 08/10/15 1700  . traZODone (DESYREL) tablet 150 mg  150 mg Oral QHS Charm RingsJamison Y Lord, NP   150 mg at 08/09/15 2014    Lab Results:  Results for orders placed or performed during the hospital encounter of 08/09/15 (from the past 48 hour(s))  Rapid urine drug screen (hospital performed)     Status: Abnormal   Collection Time: 08/10/15  6:14 PM  Result Value Ref Range   Opiates NONE DETECTED NONE DETECTED   Cocaine NONE DETECTED NONE DETECTED   Benzodiazepines NONE DETECTED NONE DETECTED   Amphetamines NONE DETECTED NONE DETECTED   Tetrahydrocannabinol POSITIVE (A) NONE DETECTED   Barbiturates NONE DETECTED NONE DETECTED    Comment:        DRUG SCREEN FOR MEDICAL PURPOSES ONLY.  IF CONFIRMATION IS NEEDED FOR ANY PURPOSE, NOTIFY LAB WITHIN 5 DAYS.        LOWEST DETECTABLE LIMITS FOR URINE DRUG SCREEN Drug Class       Cutoff (ng/mL) Amphetamine      1000 Barbiturate      200 Benzodiazepine   200 Tricyclics       300 Opiates          300 Cocaine          300 THC              50 Performed at [facility]   TSH     Status: None   Collection Time: 08/11/15  6:29 AM  Result Value Ref Range   TSH 0.859 0.350 - 4.500 uIU/mL    Comment: Performed at [facility]  Lipid panel     Status: Abnormal   Collection Time: 08/11/15  6:29 AM  Result Value Ref Range   Cholesterol 88 0 - 200 mg/dL   Triglycerides 70 <045<150 mg/dL   HDL 39 (L) >40>40 mg/dL   Total CHOL/HDL Ratio 2.3 RATIO   VLDL 14 0 -  40 mg/dL   LDL Cholesterol 35 0 - 99 mg/dL    Comment:        Total  Cholesterol/HDL:CHD Risk Coronary Heart Disease Risk Table                     Men   Women  1/2 Average Risk   3.4   3.3  Average Risk       5.0   4.4  2 X Average Risk   9.6   7.1  3 X Average Risk  23.4   11.0        Use the calculated Patient Ratio above and the CHD Risk Table to determine the patient's CHD Risk.        ATP III CLASSIFICATION (LDL):  <100     mg/dL   Optimal  409-811  mg/dL   Near or Above                    Optimal  130-159  mg/dL   Borderline  914-782  mg/dL   High  >956     mg/dL   Very High Performed at [facility]     Blood Alcohol level:  Lab Results  Component Value Date   [facility] <5 08/08/2015   ETH <11 09/06/2013    Physical Findings: AIMS:  , ,  ,  ,    CIWA:    COWS:     Musculoskeletal: Strength & Muscle Tone: within normal limits Gait & Station: normal Patient leans: N/A  Psychiatric Specialty Exam: Physical Exam  Nursing note and vitals reviewed.   Review of Systems  Unable to perform ROS: mental acuity    Blood pressure 149/98, pulse 59, temperature 97.7 F (36.5 C), temperature source Oral, resp. rate 16, height 5\' 9"  (1.753 m), weight 68.947 kg (152 lb).Body mass index is 22.44 kg/(m^2).  General Appearance: Fairly Groomed  Eye Contact:  Fair  Speech:  Pressured  Volume:  Increased  Mood:  Angry and Irritable  Affect:  Labile  Thought Process:  Disorganized and Descriptions of Associations: Loose  Orientation:  Other:  person, place  Thought Content:  Tangential and delusional  Suicidal Thoughts:  did not express - but is disorganized and delusional  Homicidal Thoughts:  did not express any  Memory:  Immediate;   Fair Recent;   Fair Remote;   Fair  Judgement:  Poor  Insight:  Shallow  Psychomotor Activity:  Restlessness  Concentration:  Concentration: Poor and Attention Span: Poor  Recall:  Poor  Fund of Knowledge:  Poor  Language:  Fair  Akathisia:  No  Handed:  Right  AIMS (if indicated):     Assets:   Social Support  ADL's:  Intact  Cognition:  WNL  Sleep:  Number of Hours: 6.75     Treatment Plan Summary:Bryan Tran is a 29 y old AAM , who is single , unemployed , lives with his brother in [place] , who has a hx of Bipolar disorder as well as severe cannabis abuse , who presented to [facility] brought in by EMS for disorganized , manic behavior. Pt is manic ,and has been refusing PO medications. Dr.Cobos has evaluated pt for second opinion for forced medication order.  Daily contact with patient to assess and evaluate symptoms and progress in treatment and Medication management   Reviewed past medical records,treatment plan.  Will start patient on Forced medication order. Will start a trial of Abilify 5 mg  po BID/Zyprexa 5 mg IM BID if he refuses PO Abilify. Plan to discharge pt on Abilify Maintena IM if he tolerates it. Will add Cogentin 0.5 mg po/IM  bid for EPS. Will add Ativan 0.5 mg PO/IM bid for restlessness. Will continue Depakote ER 750 mg po qhs for mood sx- depakote level in 5 days. Will make available PRN medications as per agitation protocol. Will continue to monitor vitals ,medication compliance and treatment side effects while patient is here.  Will monitor for medical issues as well as call consult as needed.  Reviewed labs k+ level is low - will replace with Kdur , UDS- pos for THC , TSH- wnl , LIPID PANEL- wnl , pending HBA1C, PL as well as EKG for qtc. Collateral information was obtained from mother - Bryan Tran - see H&P. CSW will continue  working on disposition.  Patient to participate in therapeutic milieu .   Bryan Nack, MD 08/11/2015, 12:58 PM

## 2015-08-12 LAB — HEMOGLOBIN A1C
Hgb A1c MFr Bld: 5.2 % (ref 4.8–5.6)
Mean Plasma Glucose: 103 mg/dL

## 2015-08-12 LAB — PROLACTIN: PROLACTIN: 40.5 ng/mL — AB (ref 4.0–15.2)

## 2015-08-12 MED ORDER — ARIPIPRAZOLE 10 MG PO TABS
10.0000 mg | ORAL_TABLET | Freq: Two times a day (BID) | ORAL | Status: DC
  Administered 2015-08-12 – 2015-08-14 (×4): 10 mg via ORAL
  Filled 2015-08-12 (×6): qty 1

## 2015-08-12 MED ORDER — OLANZAPINE 10 MG IM SOLR
5.0000 mg | Freq: Two times a day (BID) | INTRAMUSCULAR | Status: DC
  Filled 2015-08-12 (×6): qty 10

## 2015-08-12 MED ORDER — DIVALPROEX SODIUM ER 250 MG PO TB24
750.0000 mg | ORAL_TABLET | Freq: Every evening | ORAL | Status: DC
  Administered 2015-08-12 – 2015-08-15 (×4): 750 mg via ORAL
  Filled 2015-08-12 (×5): qty 3

## 2015-08-12 NOTE — Progress Notes (Signed)
Pt irritable, cursing on the unit, stating he didn't know why he was being held here. Pt stated Ativan has an opposite affect on him. Writer talked to pt and pt agreed that he would keep calm . Pt stated he didn't care about anything. If any man other than Clinical research associatewriter, Mr. Art and Mr. Simonne ComeLeo put their hands on him we would have to get 9 men to control him,  Writer explained to pt that he needs to try to use his coping skills to help himself maintain control. 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 Pt talks a lot about Rastafarian and killing people. Pt stated this is the lifestile he lives and what he is accustomed to.

## 2015-08-12 NOTE — [facility] Group Notes (Signed)
[facility]   08/12/2015 10:51 AM  Participation Quality:  Engaged  Mood/Affect:  Appropriate  Depression Rating:    Anxiety Rating:    Thoughts of Suicide:  No Will you contract for safety?   No  Current AVH:  No  Plan for Discharge/Comments:   [name] appears grandiose and appears to be experiencing some impulsivity. He is progressing since admission as he is able to hold a more structured conversation without becoming tangential (although at times he mentioned off topic issues about his family). He states that [place] whether immediately or years from now with his family and is looking forward to seeing his grandmother.   Transportation Means:   Supports:  Daryel GeraldNorth, Larue Lightner B

## 2015-08-12 NOTE — Progress Notes (Signed)
DAR NOTE: Pt present with bright affect and bright mood today. Pt has been observed on the hallway with peers reading bible most of the time, pt talks rapidly and hard to understand. Pt denies any physical pain, provided with medications per providers orders. Pt's labs and vitals were monitored throughout the day. Pt supported emotionally and encouraged to express concerns and questions. Pt educated on medications. Pt's safety ensured with 15 minute and environmental checks. Pt currently denies SI/HI and A/V hallucinations. Pt verbally agrees to seek staff if SI/HI or A/VH occurs and to consult with staff before acting on any harmful thoughts. Will continue POC.

## 2015-08-12 NOTE — Progress Notes (Signed)
This patient did not attend group.

## 2015-08-12 NOTE — Progress Notes (Signed)
Pt stated he was not going to take medications when he leaves. Pt stated he would sell them on the street. Pt feels he does not need medications and all he needs is his mariajuana.

## 2015-08-12 NOTE — Progress Notes (Signed)
  D: Pt spoke in a deep [place] accent, in almost a whisper. Writer found it difficult to follow the pt in conversation. Pt was restless and paced back and forth to the nurses station thru the first part of the shift. Pt refused po medications. Pt has no questions or concerns.    A:  Support and encouragement was offered. 15 min checks continued for safety.  R: Pt remains safe.

## 2015-08-12 NOTE — [facility] Group Notes (Signed)
[facility]  08/12/2015 , 11:31 AM    Type of Therapy:  [employer] Presentation  Participation Level:  Active  Participation Quality:  Attentive  Affect:  Blunted  Cognitive:  Oriented  Insight:  Limited  Engagement in Therapy:  Engaged  Modes of Intervention:  Discussion, Education and Socialization  Summary of Progress/Problems:  Onalee HuaDavid from [employer] came to present his recovery story and play the guitar.  Attended initially.  Talking and joking with presenter.  Remembered him from hospitalization 4 years ago.  Left after 15 minutes.  Returned.  Stayed briefly and left again.  Pants were completely soaked with liquid at that appearance.  Daryel Geraldorth, Lititia Sen B 08/12/2015 , 11:31 AM

## 2015-08-12 NOTE — Progress Notes (Signed)
[facility] MD Progress Note  08/12/2015 3:02 PM Bryan Tran  MRN:  [id number] Subjective:  Patient states " I am in church ."    Objective:Bryan Tran is a 82 y old AAM , who is single , unemployed , lives with his brother in [place] , who has a hx of Bipolar disorder as well as severe cannabis abuse , who presented to [facility] brought in by EMS for disorganized , manic behavior .  Patient seen and chart reviewed.Discussed patient with treatment team.  Patient continues to be manic, has rambling , pressured speech , has loose , tangential thought process , continues to be hyperactive, labile and intrusive on the unit. Pt has been taking some of his PO medications- with a lot of encouragement from staff. Pt is on Forced medication order.      Principal Problem: Bipolar disorder, current episode manic severe with psychotic features [facility]) Diagnosis:   Patient Active Problem List   Diagnosis Date Noted  . Bipolar disorder, current episode manic severe with psychotic features (HCC) [F31.2] 08/10/2015  . Cannabis use disorder, severe, dependence (HCC) [F12.20] 08/08/2015   Total Time spent with patient: 30 minutes  Past Psychiatric History: Pt was admitted at [facility] 7/3- 09/17/2013 , [facility] - 6/10- 08/28/13. Pt denies past suicide attempts.Currently he is noncompliant on medications  Past Medical History:  Past Medical History  Diagnosis Date  . Bipolar I disorder (HCC) manic  . Psychosis   . Marihuana dependence (HCC)    History reviewed. No pertinent past surgical history. Family History:  Family History  Problem Relation Age of Onset  . Hypertension Mother   . Depression Father    Family Psychiatric  History: Per mother father has hx of depression and has attempted suicide in the past. Social History: Pt is single, unemployed , was able to hold a job in the past off and on, denies legal issues, lives with his brother in [place]  History  Alcohol Use No     History  Drug  Use  . Yes  . Special: Marijuana    Social History   Social History  . Marital Status: Single    Spouse Name: N/A  . Number of Children: N/A  . Years of Education: N/A   Social History Main Topics  . Smoking status: Current Every Day Smoker -- 0.50 packs/day    Types: Cigarettes  . Smokeless tobacco: None  . Alcohol Use: No  . Drug Use: Yes    Special: Marijuana  . Sexual Activity: Yes    Birth Control/ Protection: None   Other Topics Concern  . None   Social History Narrative   Additional Social History:                         Sleep: does not respond  Appetite:  observed as fair  Current Medications: Current Facility-Administered Medications  Medication Dose Route Frequency Provider Last Rate Last Dose  . acetaminophen (TYLENOL) tablet 650 mg  650 mg Oral Q4H PRN Charm Rings, NP      . alum & mag hydroxide-simeth (MAALOX/MYLANTA) 200-200-20 MG/5ML suspension 30 mL  30 mL Oral Q4H PRN Charm Rings, NP      . ARIPiprazole (ABILIFY) tablet 5 mg  5 mg Oral BID Jomarie Longs, MD   5 mg at 08/12/15 7829   Or  . OLANZapine (ZYPREXA) injection 5 mg  5 mg Intramuscular BID Jomarie Longs, MD      .  benztropine (COGENTIN) tablet 0.5 mg  0.5 mg Oral BID Jomarie Longs, MD   0.5 mg at 08/12/15 0852  . divalproex (DEPAKOTE ER) 24 hr tablet 750 mg  750 mg Oral QPM Bryan Rosenberg, MD      . hydrOXYzine (ATARAX/VISTARIL) tablet 25 mg  25 mg Oral BID PC Charm Rings, NP   25 mg at 08/12/15 0852  . LORazepam (ATIVAN) tablet 0.5 mg  0.5 mg Oral BH-q12n4p Bryan Mathison, MD   0.5 mg at 08/12/15 1209   Or  . LORazepam (ATIVAN) injection 0.5 mg  0.5 mg Intramuscular BH-q12n4p Jomarie Longs, MD   0.5 mg at 08/11/15 1528  . LORazepam (ATIVAN) tablet 1 mg  1 mg Oral Q6H PRN Jomarie Longs, MD       Or  . LORazepam (ATIVAN) injection 1 mg  1 mg Intramuscular Q6H PRN Bryan Dearman, MD      . magnesium hydroxide (MILK OF MAGNESIA) suspension 30 mL  30 mL Oral Daily PRN  Charm Rings, NP      . nicotine polacrilex (NICORETTE) gum 2 mg  2 mg Oral PRN Jomarie Longs, MD   2 mg at 08/12/15 0818  . potassium chloride SA (K-DUR,KLOR-CON) CR tablet 20 mEq  20 mEq Oral BID Jomarie Longs, MD   20 mEq at 08/12/15 0852  . traZODone (DESYREL) tablet 150 mg  150 mg Oral QHS Charm Rings, NP   150 mg at 08/09/15 2014    Lab Results:  Results for orders placed or performed during the hospital encounter of 08/09/15 (from the past 48 hour(s))  Rapid urine drug screen (hospital performed)     Status: Abnormal   Collection Time: 08/10/15  6:14 PM  Result Value Ref Range   Opiates NONE DETECTED NONE DETECTED   Cocaine NONE DETECTED NONE DETECTED   Benzodiazepines NONE DETECTED NONE DETECTED   Amphetamines NONE DETECTED NONE DETECTED   Tetrahydrocannabinol POSITIVE (A) NONE DETECTED   Barbiturates NONE DETECTED NONE DETECTED    Comment:        DRUG SCREEN FOR MEDICAL PURPOSES ONLY.  IF CONFIRMATION IS NEEDED FOR ANY PURPOSE, NOTIFY LAB WITHIN 5 DAYS.        LOWEST DETECTABLE LIMITS FOR URINE DRUG SCREEN Drug Class       Cutoff (ng/mL) Amphetamine      1000 Barbiturate      200 Benzodiazepine   200 Tricyclics       300 Opiates          300 Cocaine          300 THC              50 Performed at [facility]   TSH     Status: None   Collection Time: 08/11/15  6:29 AM  Result Value Ref Range   TSH 0.859 0.350 - 4.500 uIU/mL    Comment: Performed at [facility]  Lipid panel     Status: Abnormal   Collection Time: 08/11/15  6:29 AM  Result Value Ref Range   Cholesterol 88 0 - 200 mg/dL   Triglycerides 70 <409 mg/dL   HDL 39 (L) >81 mg/dL   Total CHOL/HDL Ratio 2.3 RATIO   VLDL 14 0 - 40 mg/dL   LDL Cholesterol 35 0 - 99 mg/dL    Comment:        Total Cholesterol/HDL:CHD Risk Coronary Heart Disease Risk Table  Men   Women  1/2 Average Risk   3.4   3.3  Average Risk       5.0   4.4  2 X Average  Risk   9.6   7.1  3 X Average Risk  23.4   11.0        Use the calculated Patient Ratio above and the CHD Risk Table to determine the patient's CHD Risk.        ATP III CLASSIFICATION (LDL):  <100     mg/dL   Optimal  536-644  mg/dL   Near or Above                    Optimal  130-159  mg/dL   Borderline  034-742  mg/dL   High  >595     mg/dL   Very High Performed at [facility]   Prolactin     Status: Abnormal   Collection Time: 08/11/15  6:29 AM  Result Value Ref Range   Prolactin 40.5 (H) 4.0 - 15.2 ng/mL    Comment: (NOTE) Performed At: [facility] [address] [place], [place] [id number] Mila Homer MD [id number] Performed at [facility]   Hemoglobin A1c     Status: None   Collection Time: 08/11/15  6:29 AM  Result Value Ref Range   Hgb A1c MFr Bld 5.2 4.8 - 5.6 %    Comment: (NOTE)         Pre-diabetes: 5.7 - 6.4         Diabetes: >6.4         Glycemic control for adults with diabetes: <7.0    Mean Plasma Glucose 103 mg/dL    Comment: (NOTE) Performed At: [facility] [address] [place], [place] [id number] Mila Homer MD AT:[id number] Performed at [facility]     Blood Alcohol level:  Lab Results  Component Value Date   [facility] <5 08/08/2015   ETH <11 09/06/2013    Physical Findings: AIMS:  , ,  ,  ,    CIWA:    COWS:     Musculoskeletal: Strength & Muscle Tone: within normal limits Gait & Station: normal Patient leans: N/A  Psychiatric Specialty Exam: Physical Exam  Nursing note and vitals reviewed.   Review of Systems  Unable to perform ROS: mental acuity    Blood pressure 128/85, pulse 97, temperature 97.5 F (36.4 C), temperature source Oral, resp. rate 18, height 5\' 9"  (1.753 m), weight 68.947 kg (152 lb).Body mass index is 22.44 kg/(m^2).  General Appearance: Fairly Groomed  Eye Contact:  Fair  Speech:  Pressured  Volume:  Increased  Mood:  Irritable   Affect:  Labile  Thought Process:  Disorganized and Descriptions of Associations: Loose  Orientation:  Other:  person, place  Thought Content:  Tangential and delusional  Suicidal Thoughts:  did not express - but is disorganized and delusional  Homicidal Thoughts:  did not express any  Memory:  Immediate;   Fair Recent;   Fair Remote;   Fair  Judgement:  Poor  Insight:  Shallow  Psychomotor Activity:  Restlessness  Concentration:  Concentration: Poor and Attention Span: Poor  Recall:  Poor  Fund of Knowledge:  Poor  Language:  Fair  Akathisia:  No  Handed:  Right  AIMS (if indicated):     Assets:  Social Support  ADL's:  Intact  Cognition:  WNL  Sleep:  Number of  Hours: 5.5     Treatment Plan Summary:Jasmin Zenda AlpersWebster is a [age] y old AAM , who is single , unemployed , lives with his brother in [place] , who has a hx of Bipolar disorder as well as severe cannabis abuse , who [facility] for disorganized , manic behavior. Pt is manic ,and since he were refusing PO medications was started on Forced medication order PER Second opinion obtained by Dr.Cobos. Will continue treatment.   Daily contact with patient to assess and evaluate symptoms and progress in treatment and Medication management   Reviewed past medical records,treatment plan.  Started patient on Forced medication order. Will increase  Abilify to 10 mg po BID/Zyprexa 5 mg IM BID if he refuses PO Abilify. Plan to discharge pt on Abilify Maintena IM if he tolerates it. Will continue  Cogentin 0.5 mg po/IM  bid for EPS. Will continue  Ativan 0.5 mg PO/IM bid for restlessness. Will continue Depakote ER 750 mg po , but change to qpm since he has been refusing it . Depakote level in 5 days once he is compliant. Will make available PRN medications as per agitation protocol. Will continue to monitor vitals ,medication compliance and treatment side effects while patient is here.  Will monitor for medical  issues as well as call consult as needed.  Reviewed labs k+ level is low - will replace with Kdur , UDS- pos for THC , TSH- wnl , LIPID PANEL- wnl , pending HBA1C, PL as well as EKG for qtc. Collateral information was obtained from mother - sophia Spiker - see H&P. CSW will continue  working on disposition.  Patient to participate in therapeutic milieu .   Angelli Baruch, MD 08/12/2015, 3:02 PM

## 2015-08-13 NOTE — [facility] Group Notes (Signed)
[facility] Group Notes:  (Counselor/Nursing/MHT/Case Management/Adjunct)  08/13/2015 1:15PM  Type of Therapy:  Group Therapy  Participation Level:  Active  Participation Quality:  Appropriate  Affect:  Flat  Cognitive:  Oriented  Insight:  Improving  Engagement in Group:  Limited  Engagement in Therapy:  Limited  Modes of Intervention:  Discussion, Exploration and Socialization  Summary of Progress/Problems: The topic for group was balance in life.  Pt participated in the discussion about when their life was in balance and out of balance and how this feels.  Pt discussed ways to get back in balance and short term goals they can work on to get where they want to be.   Bryan Tran attended group for the first 20 minutes and dismissed himself. No interaction  Bryan Tran, Bryan Tran 08/13/2015 12:58 PM

## 2015-08-13 NOTE — Progress Notes (Signed)
Patient attended karaoke group tonight.  

## 2015-08-13 NOTE — Progress Notes (Signed)
Pt continues to be hyper-verbal with staff and peers.

## 2015-08-13 NOTE — Progress Notes (Signed)
Pt stated his father stabbed himself in the stomach while he was on some psychiatric medications. Pt stated he used to have anger issues when he was younger, pt said since he has been smoking mariajuana from the age of 20 he has been mellow, he stated he only gets out of control when he stops smoking.

## 2015-08-13 NOTE — Progress Notes (Signed)
DAR NOTE: Patient continues to be loud, intrusive and hyperactive on the unit.  Denies pain, auditory and visual hallucinations.  Rates depression at 0, hopelessness at 0, and anxiety at 0.  Maintained on routine safety checks.  Medications given as prescribed.  Support and encouragement offered as needed.  Attended group and participated.   Patient observed socializing with peers in the dayroom.  Offered no complaint.

## 2015-08-13 NOTE — Progress Notes (Signed)
[facility] MD Progress Note  08/13/2015 11:34 AM Joon Pohle  MRN:  [id number] Subjective:  Patient states " I am ok , lady psychiatrist.'     Objective:Woodard Nanney is a 78 y old AAM , who is single , unemployed , lives with his brother in [place] , who has a hx of Bipolar disorder as well as severe cannabis abuse , who presented to [facility] brought in by EMS for disorganized , manic behavior .  Patient seen and chart reviewed.Discussed patient with treatment team.  Patient continues to be manic, has rambling , pressured speech often , with loose , tangential thought process  and continues to be hyperactive, labile and intrusive on the unit. Pt however is more redirectable , was able to respond to majority of questions asked this AM during evaluation, although he needed constant redirection to return to topic. Pt has been taking his PO medications- with a lot of encouragement from staff. Pt is on Forced medication order.      Principal Problem: Bipolar disorder, current episode manic severe with psychotic features [facility]) Diagnosis:   Patient Active Problem List   Diagnosis Date Noted  . Bipolar disorder, current episode manic severe with psychotic features (HCC) [F31.2] 08/10/2015  . Cannabis use disorder, severe, dependence (HCC) [F12.20] 08/08/2015   Total Time spent with patient: 30 minutes  Past Psychiatric History: Pt was admitted at [facility] 7/3- 09/17/2013 , [facility] - 6/10- 08/28/13. Pt denies past suicide attempts.Currently he is noncompliant on medications  Past Medical History:  Past Medical History  Diagnosis Date  . Bipolar I disorder (HCC) manic  . Psychosis   . Marihuana dependence (HCC)    History reviewed. No pertinent past surgical history. Family History:  Family History  Problem Relation Age of Onset  . Hypertension Mother   . Depression Father    Family Psychiatric  History: Per mother father has hx of depression and has attempted suicide in the  past. Social History: Pt is single, unemployed , was able to hold a job in the past off and on, denies legal issues, lives with his brother in [place]  History  Alcohol Use No     History  Drug Use  . Yes  . Special: Marijuana    Social History   Social History  . Marital Status: Single    Spouse Name: N/A  . Number of Children: N/A  . Years of Education: N/A   Social History Main Topics  . Smoking status: Current Every Day Smoker -- 0.50 packs/day    Types: Cigarettes  . Smokeless tobacco: None  . Alcohol Use: No  . Drug Use: Yes    Special: Marijuana  . Sexual Activity: Yes    Birth Control/ Protection: None   Other Topics Concern  . None   Social History Narrative   Additional Social History:                         Sleep: Fair  Appetite:  reports that he had breakfast  Current Medications: Current Facility-Administered Medications  Medication Dose Route Frequency Provider Last Rate Last Dose  . acetaminophen (TYLENOL) tablet 650 mg  650 mg Oral Q4H PRN Charm Rings, NP      . alum & mag hydroxide-simeth (MAALOX/MYLANTA) 200-200-20 MG/5ML suspension 30 mL  30 mL Oral Q4H PRN Charm Rings, NP      . ARIPiprazole (ABILIFY) tablet 10 mg  10 mg Oral BID Helvi Royals  Mersedes Alber, MD   10 mg at 08/13/15 0806   Or  . OLANZapine (ZYPREXA) injection 5 mg  5 mg Intramuscular BID Jomarie LongsSaramma Gladyce Mcray, MD      . benztropine (COGENTIN) tablet 0.5 mg  0.5 mg Oral BID Jomarie LongsSaramma Greely Atiyeh, MD   0.5 mg at 08/13/15 0806  . divalproex (DEPAKOTE ER) 24 hr tablet 750 mg  750 mg Oral QPM Roberto Hlavaty, MD   750 mg at 08/12/15 1706  . hydrOXYzine (ATARAX/VISTARIL) tablet 25 mg  25 mg Oral BID PC Charm RingsJamison Y Lord, NP   25 mg at 08/13/15 0806  . LORazepam (ATIVAN) tablet 0.5 mg  0.5 mg Oral BH-q12n4p Nathanel Tallman, MD   0.5 mg at 08/13/15 1131   Or  . LORazepam (ATIVAN) injection 0.5 mg  0.5 mg Intramuscular BH-q12n4p Jomarie LongsSaramma Anatalia Kronk, MD   0.5 mg at 08/11/15 1528  . LORazepam (ATIVAN) tablet 1 mg   1 mg Oral Q6H PRN Jomarie LongsSaramma Carlton Sweaney, MD       Or  . LORazepam (ATIVAN) injection 1 mg  1 mg Intramuscular Q6H PRN Juliza Machnik, MD      . magnesium hydroxide (MILK OF MAGNESIA) suspension 30 mL  30 mL Oral Daily PRN Charm RingsJamison Y Lord, NP      . nicotine polacrilex (NICORETTE) gum 2 mg  2 mg Oral PRN Jomarie LongsSaramma Arryn Terrones, MD   2 mg at 08/13/15 0607  . traZODone (DESYREL) tablet 150 mg  150 mg Oral QHS Charm RingsJamison Y Lord, NP   150 mg at 08/09/15 2014    Lab Results:  No results found for this or any previous visit (from the past 48 hour(s)).  Blood Alcohol level:  Lab Results  Component Value Date   ETH <5 08/08/2015   ETH <11 09/06/2013    Physical Findings: AIMS:  , ,  ,  ,    CIWA:    COWS:     Musculoskeletal: Strength & Muscle Tone: within normal limits Gait & Station: normal Patient leans: N/A  Psychiatric Specialty Exam: Physical Exam  Nursing note and vitals reviewed.   Review of Systems  Psychiatric/Behavioral: The patient is nervous/anxious.   All other systems reviewed and are negative.   Blood pressure 141/95, pulse 75, temperature 98.3 F (36.8 C), temperature source Oral, resp. rate 20, height 5\' 9"  (1.753 m), weight 68.947 kg (152 lb).Body mass index is 22.44 kg/(m^2).  General Appearance: Fairly Groomed  Eye Contact:  Fair  Speech:  Pressured  Volume:  Increased  Mood:  Irritable on and off  Affect:  Labile  Thought Process:  Disorganized and Descriptions of Associations: Loose  Orientation:  Other:  person, place  Thought Content:  Tangential and delusional  Suicidal Thoughts:  No but is very manic and disorganized  Homicidal Thoughts:  did not express any  Memory:  Immediate;   Fair Recent;   Fair Remote;   Fair  Judgement:  Poor  Insight:  Shallow  Psychomotor Activity:  Restlessness  Concentration:  Concentration: Poor and Attention Span: Poor  Recall:  Poor  Fund of Knowledge:  Poor  Language:  Fair  Akathisia:  No  Handed:  Right  AIMS (if  indicated):     Assets:  Social Support  ADL's:  Intact  Cognition:  WNL  Sleep:  Number of Hours: 3.5     Treatment Plan Summary:Jayvian Zenda AlpersWebster is a [age] y old AAM , who is single , unemployed , lives with his brother in [place] , who has a hx  of Bipolar disorder as well as severe cannabis abuse , who presented to [facility] brought in by EMS for disorganized , manic behavior. Pt is manic ,and since he were refusing PO medications was started on Forced medication order PER Second opinion obtained by Dr.Cobos. Will continue treatment.   Daily contact with patient to assess and evaluate symptoms and progress in treatment and Medication management   Reviewed past medical records,treatment plan.  Started patient on Forced medication order. Increased Abilify to 10 mg po BID/Zyprexa 5 mg IM BID if he refuses PO Abilify. Plan to discharge pt on Abilify Maintena IM if he tolerates it. Will continue  Cogentin 0.5 mg po/IM  bid for EPS. Will continue  Ativan 0.5 mg PO/IM bid for restlessness. Will continue Depakote ER 750 mg po , but change to qpm since he has been refusing it . Depakote level on 6/11/7. Will make available PRN medications as per agitation protocol. Will continue to monitor vitals ,medication compliance and treatment side effects while patient is here.  Will monitor for medical issues as well as call consult as needed.  Reviewed labs k+ level is low - will replace with Kdur , UDS- pos for THC , TSH- wnl , LIPID PANEL- wnl , HBA1C- wnl , PL - elevated - will need to be monitored , pending EKG for qtc ( pt declined)  Collateral information was obtained from mother - sophia Lemberger - see H&P. CSW will continue  working on disposition.  Patient to participate in therapeutic milieu .   Alexsys Eskin, MD 08/13/2015, 11:34 AM

## 2015-08-13 NOTE — [facility] Group Notes (Signed)
[facility] Group Notes:  (Nursing/MHT/Case Management/Adjunct)  Date:  08/13/2015  Time:  10:22 AM  Type of Therapy:  Nurse Education  Participation Level:  Active  Participation Quality:  Appropriate and Attentive  Affect:  Appropriate  Cognitive:  Alert and Appropriate  Insight:  Appropriate and Good  Engagement in Group:  Engaged  Modes of Intervention:  Discussion and Education  Summary of Progress/Problems: Topic was on leisure and lifestyle changes. Discussed the importance of choosing a healthy leisure activities. Group encouraged to surround themselves with positive and healthy group/support system when changing to a healthy lifestyle. Patient was receptive and contributed.    Mickie Baillizabeth O Iwenekha 08/13/2015, 10:22 AM

## 2015-08-13 NOTE — Progress Notes (Signed)
Pt continues to need constant re-direction. Pt continues to state that if we make him take shots he plans to fight, punch whoever comes at him. Pt continues to state no one can make him take any medication when he leaves [facility].

## 2015-08-14 MED ORDER — ARIPIPRAZOLE 15 MG PO TABS
15.0000 mg | ORAL_TABLET | Freq: Two times a day (BID) | ORAL | Status: DC
  Administered 2015-08-14 – 2015-08-22 (×16): 15 mg via ORAL
  Filled 2015-08-14 (×20): qty 1

## 2015-08-14 MED ORDER — OLANZAPINE 10 MG IM SOLR
5.0000 mg | Freq: Two times a day (BID) | INTRAMUSCULAR | Status: DC
Start: 2015-08-14 — End: 2015-08-22
  Filled 2015-08-14 (×20): qty 10

## 2015-08-14 MED ORDER — NICOTINE 14 MG/24HR TD PT24
14.0000 mg | MEDICATED_PATCH | Freq: Every day | TRANSDERMAL | Status: DC
  Administered 2015-08-14 – 2015-08-17 (×4): 14 mg via TRANSDERMAL
  Filled 2015-08-14 (×6): qty 1

## 2015-08-14 NOTE — Tx Team (Signed)
Interdisciplinary Treatment Plan Update (Adult)  Date:  08/14/2015   Time Reviewed:  11:54 AM   Progress in Treatment: Attending groups: Intermittently Participating in groups: Inappropriately Taking medication as prescribed:  Yes. Tolerating medication:  Yes. Family/Significant other contact made:  Yes Patient understands diagnosis:  No  Limited insight Discussing patient identified problems/goals with staff:  Yes, see initial care plan. Medical problems stabilized or resolved:  Yes. Denies suicidal/homicidal ideation: Yes. Issues/concerns per patient self-inventory:  No. Other:  New problem(s) identified:  Discharge Plan or Barriers: see below  Reason for Continuation of Hospitalization: Mania Medication stabilization Other; describe Disorganization  Comments:  Patient comes from brother house. He has been rambling like this for three days. Patient has not been taking any of his medicine that he was on. Brother could not remember the medicines that patient is on. Patient has not been sleeping. Brother called EMS due to the rambling.     Pt today continues to be disorganized and delusional as well as manic  Will increase Haldol to 5 mg po bid for psychosis. Will add Cogentin 0.5 mg po bid for EPS. Will add Depakote ER 750 mg po qhs for mood sx- depakote level in 5 days.  08/14/15:  Pt continues to present with mania. Started patient on Forced medication order. Increased Abilify to 10 mg po BID/Zyprexa 5 mg IM BID if he refuses PO Abilify. Plan to discharge pt on Abilify Maintena IM if he tolerates it.  Estimated length of stay: 4-5 days  New goal(s):  Review of initial/current patient goals per problem list:   Review of initial/current patient goals per problem list:  1. Goal(s): Patient will participate in aftercare plan   Met: Yes   Target date: 3-5 days post admission date   As evidenced by: Patient will participate within aftercare plan AEB aftercare provider and  housing plan at discharge being identified. 08/11/15:  Return home, follow up outpt      6. Goal (s): Patient will demonstrate decreased signs of mania  * Met: No  * Target date: 3-5 days post admission date  * As evidenced by: Patient demonstrate decreased signs of mania AEB decreased mood instability and return to baseline functioning 08/11/15:  Presents with pressured speech, impulsivity, intrusiveness, grandiosity and delusions.  Was not medication compliant prior to admission 08/14/15:  Pt is now taking meds orally after getting injections earlier this week.  However, continues with mood lability, pressured speech, impulsivity      Attendees: Patient:  08/14/2015 11:54 AM   Family:   08/14/2015 11:54 AM   Physician:  Ursula Alert, MD 08/14/2015 11:54 AM   Nursing:   Hedy Jacob, RN 08/14/2015 11:54 AM   CSW:    Roque Lias, LCSW   08/14/2015 11:54 AM   Other:  08/14/2015 11:54 AM   Other:   08/14/2015 11:54 AM   Other:  Lars Pinks, Nurse CM 08/14/2015 11:54 AM   Other:   08/14/2015 11:54 AM   Other:  Norberto Sorenson, [place]  08/14/2015 11:54 AM   Other:  08/14/2015 11:54 AM   Other:  08/14/2015 11:54 AM   Other:  08/14/2015 11:54 AM   Other:  08/14/2015 11:54 AM   Other:  08/14/2015 11:54 AM   Other:   08/14/2015 11:54 AM    Scribe for Treatment Team:   Trish Mage, 08/14/2015 11:54 AM

## 2015-08-14 NOTE — Progress Notes (Signed)
Did not attend group 

## 2015-08-14 NOTE — Progress Notes (Signed)
[facility] MD Progress Note  08/14/2015 1:05 PM Wassim Kirksey  MRN:  [id number] Subjective:  Patient states " I am fine. I am going to [facility]      Objective:Bryan Tran is a 62 y old AAM , who is single , unemployed , lives with his brother in [place] , who has a hx of Bipolar disorder as well as severe cannabis abuse , who presented to [facility] brought in by EMS for disorganized , manic behavior .  Patient seen and chart reviewed.Discussed patient with treatment team.  Patient today continues to be manic, and has pressured tangential speech - however he is making some progress overall . His speech is more clear and during evaluation this AM he was able to answer question more appropriately. Pt per staff continues to need constant redirection on the unit  And is seen periodically as loud , singing in his room. Pt however has been attending some groups and has been able to be appropriate in groups during those times. Pt has been taking his PO medications- with a lot of encouragement from staff. Pt is on Forced medication order.      Principal Problem: Bipolar disorder, current episode manic severe with psychotic features [facility]) Diagnosis:   Patient Active Problem List   Diagnosis Date Noted  . Bipolar disorder, current episode manic severe with psychotic features (HCC) [F31.2] 08/10/2015  . Cannabis use disorder, severe, dependence (HCC) [F12.20] 08/08/2015   Total Time spent with patient: 30 minutes  Past Psychiatric History: Pt was admitted at [facility] 7/3- 09/17/2013 , [facility] - 6/10- 08/28/13. Pt denies past suicide attempts.Currently he is noncompliant on medications  Past Medical History:  Past Medical History  Diagnosis Date  . Bipolar I disorder (HCC) manic  . Psychosis   . Marihuana dependence (HCC)    History reviewed. No pertinent past surgical history. Family History:  Family History  Problem Relation Age of Onset  . Hypertension Mother   . Depression Father     Family Psychiatric  History: Per mother father has hx of depression and has attempted suicide in the past. Social History: Pt is single, unemployed , was able to hold a job in the past off and on, denies legal issues, lives with his brother in [place]  History  Alcohol Use No     History  Drug Use  . Yes  . Special: Marijuana    Social History   Social History  . Marital Status: Single    Spouse Name: N/A  . Number of Children: N/A  . Years of Education: N/A   Social History Main Topics  . Smoking status: Current Every Day Smoker -- 0.50 packs/day    Types: Cigarettes  . Smokeless tobacco: None  . Alcohol Use: No  . Drug Use: Yes    Special: Marijuana  . Sexual Activity: Yes    Birth Control/ Protection: None   Other Topics Concern  . None   Social History Narrative   Additional Social History:                         Sleep: Fair  Appetite:  Fair  Current Medications: Current Facility-Administered Medications  Medication Dose Route Frequency Provider Last Rate Last Dose  . acetaminophen (TYLENOL) tablet 650 mg  650 mg Oral Q4H PRN Charm Rings, NP      . alum & mag hydroxide-simeth (MAALOX/MYLANTA) 200-200-20 MG/5ML suspension 30 mL  30 mL Oral Q4H PRN  Charm RingsJamison Y Lord, NP      . ARIPiprazole (ABILIFY) tablet 15 mg  15 mg Oral BID Jomarie LongsSaramma Rossi Silvestro, MD       Or  . OLANZapine (ZYPREXA) injection 5 mg  5 mg Intramuscular BID Jomarie LongsSaramma Lilley Hubble, MD      . benztropine (COGENTIN) tablet 0.5 mg  0.5 mg Oral BID Jomarie LongsSaramma Bion Todorov, MD   0.5 mg at 08/14/15 0800  . divalproex (DEPAKOTE ER) 24 hr tablet 750 mg  750 mg Oral QPM Sheva Mcdougle, MD   750 mg at 08/13/15 1819  . hydrOXYzine (ATARAX/VISTARIL) tablet 25 mg  25 mg Oral BID PC Charm RingsJamison Y Lord, NP   25 mg at 08/14/15 0800  . LORazepam (ATIVAN) tablet 0.5 mg  0.5 mg Oral BH-q12n4p Leor Whyte, MD   0.5 mg at 08/14/15 1246   Or  . LORazepam (ATIVAN) injection 0.5 mg  0.5 mg Intramuscular BH-q12n4p Jomarie LongsSaramma Nevada Kirchner, MD    0.5 mg at 08/11/15 1528  . LORazepam (ATIVAN) tablet 1 mg  1 mg Oral Q6H PRN Jomarie LongsSaramma [place] Mall, MD   1 mg at 08/14/15 1133   Or  . LORazepam (ATIVAN) injection 1 mg  1 mg Intramuscular Q6H PRN Lauralynn Loeb, MD      . magnesium hydroxide (MILK OF MAGNESIA) suspension 30 mL  30 mL Oral Daily PRN Charm RingsJamison Y Lord, NP      . nicotine (NICODERM CQ - dosed in mg/24 hours) patch 14 mg  14 mg Transdermal Daily Jomarie LongsSaramma Mistee Soliman, MD   14 mg at 08/14/15 0835  . traZODone (DESYREL) tablet 150 mg  150 mg Oral QHS Charm RingsJamison Y Lord, NP   150 mg at 08/09/15 2014    Lab Results:  No results found for this or any previous visit (from the past 48 hour(s)).  Blood Alcohol level:  Lab Results  Component Value Date   ETH <5 08/08/2015   ETH <11 09/06/2013    Physical Findings: AIMS:  , ,  ,  ,    CIWA:    COWS:     Musculoskeletal: Strength & Muscle Tone: within normal limits Gait & Station: normal Patient leans: N/A  Psychiatric Specialty Exam: Physical Exam  Nursing note and vitals reviewed.   Review of Systems  Psychiatric/Behavioral: The patient is nervous/anxious.   All other systems reviewed and are negative.   Blood pressure 145/99, pulse 86, temperature 97.7 F (36.5 C), temperature source Oral, resp. rate 18, height 5\' 9"  (1.753 m), weight 68.947 kg (152 lb).Body mass index is 22.44 kg/(m^2).  General Appearance: Fairly Groomed  Eye Contact:  Fair  Speech:  Pressured  Volume:  Increased  Mood:  Irritable on and off  Affect:  Labile  Thought Process:  Disorganized and Descriptions of Associations: Loose improving  Orientation:  Other:  person, place  Thought Content:  Tangential and delusional  Suicidal Thoughts:  No but is very manic and disorganized  Homicidal Thoughts:  did not express any  Memory:  Immediate;   Fair Recent;   Fair Remote;   Fair  Judgement:  Poor  Insight:  Shallow  Psychomotor Activity:  Restlessness  Concentration:  Concentration: Poor and Attention Span:  Poor  Recall:  Poor  Fund of Knowledge:  Poor  Language:  Fair  Akathisia:  No  Handed:  Right  AIMS (if indicated):     Assets:  Social Support  ADL's:  Intact  Cognition:  WNL  Sleep:  Number of Hours: 4.75     Treatment  Plan Summary:Venson Mukai is a 44 y old AAM , who is single , unemployed , lives with his brother in [place] , who has a hx of Bipolar disorder as well as severe cannabis abuse , who presented to [facility] brought in by EMS for disorganized , manic behavior. Pt is manic ,and since he were refusing PO medications was started on Forced medication order PER Second opinion obtained by Dr.Cobos. Patient continues to be manic , although progressing .Will continue treatment.   Daily contact with patient to assess and evaluate symptoms and progress in treatment and Medication management   Reviewed past medical records,treatment plan.  Started patient on Forced medication order. Will increase Abilify to 15 mg po BID/Zyprexa 5 mg IM BID if he refuses PO Abilify. Plan to discharge pt on Abilify Maintena IM if he tolerates it. Will continue  Cogentin 0.5 mg po/IM  bid for EPS. Will continue  Ativan 0.5 mg PO/IM bid for restlessness. Will continue Depakote ER 750 mg po , but change to qpm since he has been refusing it . Depakote level on 6/11/7. Will make available PRN medications as per agitation protocol. Will continue to monitor vitals ,medication compliance and treatment side effects while patient is here.  Will monitor for medical issues as well as call consult as needed.  Reviewed labs k+ level is low - will replace with Kdur , UDS- pos for THC , TSH- wnl , LIPID PANEL- wnl , HBA1C- wnl , PL - elevated - will need to be monitored ,ekg- qtc - wnl. Collateral information was obtained from mother - sophia Mahr - see H&P. CSW will continue  working on disposition.  Patient to participate in therapeutic milieu .   Shiane Wenberg, MD 08/14/2015, 1:05 PM

## 2015-08-14 NOTE — [facility] Group Notes (Signed)
[facility] LCSW Group Therapy  08/14/2015  1:05 PM  Type of Therapy:  Group therapy  Participation Level:  Active  Participation Quality:  Attentive  Affect:  Flat  Cognitive:  Oriented  Insight:  Limited  Engagement in Therapy:  Limited  Modes of Intervention:  Discussion, Socialization  Summary of Progress/Problems:  Chaplain was here to lead a group on themes of hope and courage.  Came briefly.  Difficulty focusing in feedback to others.  Left after about 10 minutes and did not return.  Daryel Geraldorth, Maudy Yonan B 08/14/2015 1:43 PM

## 2015-08-14 NOTE — [facility] Group Notes (Signed)
[facility] Note   08/14/2015 12:00 PM  Participation Quality:  Disruptive  Mood/Affect:  Excited and Irritable  Depression Rating:    Anxiety Rating:    Thoughts of Suicide:  No Will you contract for safety?   NA  Current AVH:  No  Plan for Discharge/Comments:  Started out well.  Patient in a good mood.  However, when I asked him to call his mother to check in with her, he became agitated and became more pressured and tangential in speech.  Left, but returned, and fed into the mania of another patient.  Transportation Means:   Supports:  Daryel GeraldNorth, Nyeli Holtmeyer B

## 2015-08-14 NOTE — Progress Notes (Signed)
D: Pt presents with blunt affect and irritable mood on initial contact. Speech is rapid and pressured. Continues to need verbal redirection at intervals during shift for mood lability and intrusiveness. Pt had a verbal altercation with a male peer from [address] yard this shift during recreation time;  related to minority groups and  lifestyles. Per staff's report pt became verbally aggressive and jumped into peer's face (personal space) and staff went in between both patients called for help and de-escalated the situation. No physical contact was made. Pt stayed on unit for lunch and was cooperative. A: Scheduled and PRN (Ativan) medications was administered as prescribed. Continuous verbal redirection offered throughout this shift due to intrusiveness and mood lability. Emotional support, availability and encouragement provided to pt. Q 15 minutes checks remains effective for safety on and off unit.  R: Pt denies SI, HI, AVH and pain. However, pt was observed talking to self while pacing in his room and in the hall. Thoughts are logical at times but tangential with flights of ideas as well. Pt is compliant with his medications. Denies adverse drug reactions at this time. POC continues.

## 2015-08-15 NOTE — Progress Notes (Signed)
[facility] MD Progress Note  08/15/2015 12:34 PM Bryan Tran  MRN:  [id number] Subjective:  Patient states " I am fine. I am going to [facility], only thing you can do for me is release me so I can go.'  Objective:Bryan Tran is a 72 y old AAM , who is single , unemployed , lives with his brother in [place] , who has a hx of Bipolar disorder as well as severe cannabis abuse , who presented to [facility] brought in by EMS for disorganized , manic behavior .  Patient seen and chart reviewed.Discussed patient with treatment team.  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. His speech is more pressured and during evaluation this AM he was not able to answer questions appropriately, as he continued to ruminate and go off topic about so many things.  Pt per staff continues to need constant redirection on the unit  And is seen periodically as loud , singing in his room. He is observed talking to himself, and then singing and rapping all within a short matter of time.  Pt however has been attending some groups and has been able to be appropriate in groups during those times. Pt has been taking his PO medications- with a lot of encouragement from staff. Pt is on Forced medication order.  Principal Problem: Bipolar disorder, current episode manic severe with psychotic features [facility]) Diagnosis:   Patient Active Problem List   Diagnosis Date Noted  . Bipolar disorder, current episode manic severe with psychotic features (HCC) [F31.2] 08/10/2015  . Cannabis use disorder, severe, dependence (HCC) [F12.20] 08/08/2015   Total Time spent with patient: 30 minutes  Past Psychiatric History: Pt was admitted at [facility] 7/3- 09/17/2013 , [facility] - 6/10- 08/28/13. Pt denies past suicide  attempts.Currently he is noncompliant on medications  Past Medical History:  Past Medical History  Diagnosis Date  . Bipolar I disorder (HCC) manic  . Psychosis   . Marihuana dependence (HCC)    History reviewed. No pertinent past surgical history. Family History:  Family History  Problem Relation Age of Onset  . Hypertension Mother   . Depression Father    Family Psychiatric  History: Per mother father has hx of depression and has attempted suicide in the past. Social History: Pt is single, unemployed , was able to hold a job in the past off and on, denies legal issues, lives with his brother in [place]  History  Alcohol Use No     History  Drug Use  . Yes  . Special: Marijuana    Social History   Social History  . Marital Status: Single    Spouse Name: N/A  . Number of Children: N/A  . Years of Education: N/A   Social History Main Topics  . Smoking status: Current Every Day Smoker -- 0.50 packs/day    Types: Cigarettes  . Smokeless tobacco: None  . Alcohol Use: No  . Drug Use: Yes    Special: Marijuana  . Sexual Activity: Yes    Birth Control/ Protection: None   Other Topics Concern  . None   Social History Narrative   Additional Social History:    Sleep: Fair  Appetite:  Fair  Current Medications: Current Facility-Administered Medications  Medication Dose Route Frequency Provider  Last Rate Last Dose  . acetaminophen (TYLENOL) tablet 650 mg  650 mg Oral Q4H PRN Charm Rings, NP      . alum & mag hydroxide-simeth (MAALOX/MYLANTA) 200-200-20 MG/5ML suspension 30 mL  30 mL Oral Q4H PRN Charm Rings, NP      . ARIPiprazole (ABILIFY) tablet 15 mg  15 mg Oral BID Jomarie Longs, MD   15 mg at 08/15/15 1610   Or  . OLANZapine (ZYPREXA) injection 5 mg  5 mg Intramuscular BID Jomarie Longs, MD      . benztropine (COGENTIN) tablet 0.5 mg  0.5 mg Oral BID Jomarie Longs, MD   0.5 mg at 08/15/15 0823  . divalproex (DEPAKOTE ER) 24 hr tablet 750 mg  750 mg Oral  QPM Saramma Eappen, MD   750 mg at 08/14/15 1718  . hydrOXYzine (ATARAX/VISTARIL) tablet 25 mg  25 mg Oral BID PC Charm Rings, NP   25 mg at 08/15/15 0823  . LORazepam (ATIVAN) tablet 0.5 mg  0.5 mg Oral BH-q12n4p Saramma Eappen, MD   0.5 mg at 08/14/15 1658   Or  . LORazepam (ATIVAN) injection 0.5 mg  0.5 mg Intramuscular BH-q12n4p Jomarie Longs, MD   0.5 mg at 08/11/15 1528  . LORazepam (ATIVAN) tablet 1 mg  1 mg Oral Q6H PRN Jomarie Longs, MD   1 mg at 08/14/15 1133   Or  . LORazepam (ATIVAN) injection 1 mg  1 mg Intramuscular Q6H PRN Saramma Eappen, MD      . magnesium hydroxide (MILK OF MAGNESIA) suspension 30 mL  30 mL Oral Daily PRN Charm Rings, NP      . nicotine (NICODERM CQ - dosed in mg/24 hours) patch 14 mg  14 mg Transdermal Daily Jomarie Longs, MD   14 mg at 08/15/15 0824  . traZODone (DESYREL) tablet 150 mg  150 mg Oral QHS Charm Rings, NP   150 mg at 08/09/15 2014    Lab Results:  No results found for this or any previous visit (from the past 48 hour(s)).  Blood Alcohol level:  Lab Results  Component Value Date   [facility] <5 08/08/2015   ETH <11 09/06/2013    Musculoskeletal: Strength & Muscle Tone: within normal limits Gait & Station: normal Patient leans: N/A  Psychiatric Specialty Exam: Physical Exam  Nursing note and vitals reviewed.   Review of Systems  Psychiatric/Behavioral: The patient is nervous/anxious.   All other systems reviewed and are negative.   Blood pressure 127/72, pulse 87, temperature 97.5 F (36.4 C), temperature source Oral, resp. rate 18, height  (1.753 m), weight 68.947 kg (152 lb).Body mass index is 22.44 kg/(m^2).  General Appearance: Fairly Groomed  Eye Contact:  Fair  Speech:  Pressured  Volume:  Increased  Mood:  Irritable on and off  Affect:  Labile  Thought Process:  Disorganized and Descriptions of Associations: Tangential improving  Orientation:  Other:  person, place  Thought Content:  Paranoid Ideation,  Rumination, Tangential and delusional  Suicidal Thoughts:  No but is very manic and disorganized  Homicidal Thoughts:  did not express any  Memory:  Immediate;   Fair Recent;   Fair Remote;   Fair  Judgement:  Poor  Insight:  Shallow  Psychomotor Activity:  Restlessness  Concentration:  Concentration: Poor and Attention Span: Poor  Recall:  Poor  Fund of Knowledge:  Poor  Language:  Fair  Akathisia:  No  Handed:  Right  AIMS (if indicated):  Assets:  Social Support  ADL's:  Intact  Cognition:  WNL  Sleep:  Number of Hours: 2   Treatment Plan Summary:Bryan Tran is a [age] y old AAM , who is single , unemployed , lives with [place] brother in [place] , who has a hx of Bipolar disorder as well as severe cannabis abuse , [facility] for disorganized , manic behavior. Pt is manic ,and since he were refusing PO medications was started on Forced medication order PER Second opinion obtained by Dr.Cobos. Patient continues to be manic , although progressing .Will continue treatment.  Daily contact with patient to assess and evaluate symptoms and progress in treatment and Medication management   Reviewed past medical records,treatment plan.  Started patient on Forced medication order.  Will increase Abilify to 15 mg po BID/Zyprexa 5 mg IM BID if he refuses PO Abilify.  Plan to discharge pt on Abilify Maintena IM if he tolerates it. Will continue  Cogentin 0.5 mg po/IM  bid for EPS. Will continue  Ativan 0.5 mg PO/IM bid for restlessness. Will continue Depakote ER 750 mg po , but change to qpm since he has been refusing it . Depakote level on 08/16/15.  Will make available PRN medications as per agitation protocol. Will continue to monitor vitals ,medication compliance and treatment side effects while patient is here.  Will monitor for medical issues as well as call consult as needed.   Reviewed labs k+ level is low - will replace with Kdur , UDS- pos for THC ,  TSH- wnl , LIPID PANEL- wnl , HBA1C- wnl , PL - elevated - will need to be monitored ,ekg- qtc - wnl.  Collateral information was obtained from mother - sophia Leeman - see H&P. CSW will continue  working on disposition.  Patient to participate in therapeutic milieu .   Bryan Haywardakia S Starkes, FNP 08/15/2015, 12:34 PM  Reviewed the information documented and agree with the treatment plan.  Bryan Tran 08/17/2015 1:39 PM

## 2015-08-15 NOTE — Progress Notes (Signed)
D-  Patient remains hyperverbal, easily agitated and tangential.  Patient denies SI, HI and AVH.  Patient needed much encouragement to take medications.   A-  Assess patient for safety, offer medications as prescribed, engage patients in 1:1 staff talks.   R- Patient able to contract for safety.

## 2015-08-15 NOTE — Progress Notes (Signed)
Patient ID: Bryan Tran, male   DOB: 14-Feb-1996, 20 y.o.   MRN: [id number] D: Patient reports he had a good day. Pt attended and engage in evening wrap up group. Pt denies SI/HI/AVH and pain. Pt agreed to take sleep aid since he did not sleep well last night. Pt went to sleep before medication was administered.  A: Support and encouragement offered as needed.  R: Patient safe and cooperative. Will continue to monitor patient for safety and stability.

## 2015-08-15 NOTE — [facility] Group Notes (Signed)
Adult Psychoeducational Group Note  Date:  08/15/2015 Time:  9:02 PM  Group Topic/Focus:  Wrap-Up Group:   The focus of this group is to help patients review their daily goal of treatment and discuss progress on daily workbooks.  Participation Level:  Minimal  Participation Quality:  Attentive  Affect:  Flat  Cognitive:  Disorganized  Insight: Limited  Engagement in Group:  Limited  Modes of Intervention:  Discussion  Additional Comments:  Pt rated his day a 10.  Pt stated he went outside, played basketball and ate breakfast.  His goal was to get out of here.  Pt was rambling about something but Clinical research associatewriter could not make out what he was saying.  [name], Jayliah Benett A 08/15/2015, 9:02 PM

## 2015-08-15 NOTE — [facility] Group Notes (Signed)
[facility] Group Notes:  (Clinical Social Work)  08/15/2015  9:30-10:00AM  Summary of Progress/Problems:   Today's process group involved patients discussing their feelings related to being hospitalized, which was then followed by a discussion about anger feelings and how they dealt with the last angry feelings they experienced.  The patient expressed his primary feeling about being hospitalized is "tired" and he displayed considerable anger.  He was in and out of the room multiple times, and each time was distracting, whether that was through talking over the top of other group members, or just talking out loud to himself.  Type of Therapy:  Group Therapy - Process  Participation Level:  Minimal  Participation Quality:  Intrusive and Resistant  Affect:  Blunted  Cognitive:  Delusional  Insight:  Defensive  Engagement in Therapy:  Poor  Modes of Intervention:  Exploration, Discussion  Ambrose MantleMareida Grossman-Orr, LCSW 08/15/2015, 11:41 AM

## 2015-08-15 NOTE — Plan of Care (Signed)
Problem: Role Relationship: Goal: Ability to communicate needs accurately will improve Outcome: Progressing Pt is intrucive and needing redirection

## 2015-08-15 NOTE — Progress Notes (Signed)
Patient ID: Bryan Tran, male   DOB: 02/09/1996, 20 y.o.   MRN: [id number] D: Patient  hyperactive pacing the hallway. Pt intrusive needing redirection. Pt refused evening trazadone stating he can sleep without any medication. Pt appear to miss his family and made a lot of reference to his uncles band [name]. Denies  SI/HI/AVH and pain. A: Support and encouragement offered as needed.  R: Patient safe, pacing on unit. Will continue to monitor patient for safety and stability.

## 2015-08-16 LAB — VALPROIC ACID LEVEL: Valproic Acid Lvl: 56 ug/mL (ref 50.0–100.0)

## 2015-08-16 MED ORDER — DIVALPROEX SODIUM ER 500 MG PO TB24
1000.0000 mg | ORAL_TABLET | Freq: Every evening | ORAL | Status: DC
  Administered 2015-08-16 – 2015-08-20 (×5): 1000 mg via ORAL
  Filled 2015-08-16 (×8): qty 2

## 2015-08-16 NOTE — Plan of Care (Signed)
Problem: Coping: Goal: Ability to verbalize frustrations and anger appropriately will improve Outcome: Not Progressing Patient continues to express frustrations with verbal aggression.  Problem: Education: Goal: Ability to state activities that reduce stress will improve Outcome: Not Progressing Patient is unable to state activities that reduce stress at this time. Pt's speech is disorganized and pressured.

## 2015-08-16 NOTE — Progress Notes (Signed)
[facility] Group Notes:  (Nursing/MHT/Case Management/Adjunct)  Date:  08/16/2015  Time:  9:02 PM  Type of Therapy:  Psychoeducational Skills  Participation Level:  Active  Participation Quality:  Inattentive and Monopolizing  Affect:  Excited  Cognitive:  Disorganized  Insight:  Limited  Engagement in Group:  Monopolizing and Off Topic  Modes of Intervention:  Education  Summary of Progress/Problems: The spoke about a number of different topics in group. He stated that his day went "okay" and that it was a "10 out of 10". The patient had to be redirected for talking out of turn and for talking to a male peer during group. The patient mentioned that he is currently taking "18 different medications" and that he wanted to know why the doctor kept changing his medication. In addition, he said something with regards to his father bringing in clothing and that he is unclear as to when that will happen. He would like to discharged as well.   Hazle CocaGOODMAN, [name] 08/16/2015, 9:02 PM

## 2015-08-16 NOTE — Progress Notes (Signed)
D: Patient is alert and oriented. Pt's mood and affect are anxious and preoccupied. Pt denies SI/HI and AVH however is observed talking to himself. Pt's speech is pressured, tangential, and inconsistent. Pt verbalizes delusions. Pt analyzing each PO pill and discusses the numbers on the pills with RN prior to taking medication. Pt's motor function is fidgety and restless. Pt paces the hall at times. Pt needs redirection at times. Pt refused to fill out self inventory sheet today. Pt is attending unit groups. A: Active listening by RN. Encouragement/Support provided to pt. Scheduled medications administered per providers orders (See MAR). 15 minute checks continued per protocol for patient safety.  R: Pt is easily redirectable. Pt remains safe. Patient cooperative to nursing interventions.

## 2015-08-16 NOTE — Progress Notes (Signed)
[facility] MD Progress Note  08/16/2015 10:40 AM Bryan Tran  MRN:  [id number]   Subjective:  Patient states "I always think clearly. I have OCD and I dont like people messing with my stuff. I miss my cousins graduation in [place]. I know today is SUnday but I dont know the time because I dont have a watch, I hate I missed it. Im ready to go and I will take my medications. '  Objective: Bryan Tran is a 64 y old AAM , who is single , unemployed , lives with his brother in [place] , who has a hx of Bipolar disorder as well as severe cannabis abuse , who presented to [facility] brought in by EMS for disorganized, manic behavior .  Patient seen and chart reviewed. Discussed patient with treatment team.  Patient today is presents with much clearer and organized thinking, and his speech is clear and normal.  He is able to answer questions appropriately, although he continued to ruminate and go off topic about so many things. He does have goals and plans to leave the states once his brother graduates. Pt per staff continues to need constant redirection on the unit.  And is seen periodically as loud, singing in his room. Pt however has been attending some groups and has been able to be appropriate in groups during those times. Pt has been taking his PO medications- with a lot of encouragement from staff. Pt is on Forced medication order, but has been taking his medication willing. He continues to present with much improvement.  Per nursing: Patient reports he had a good day. Pt attended and engage in evening wrap up group. Pt denies SI/HI/AVH and pain. Pt agreed to take sleep aid since he did not sleep well last night. Pt went to sleep before medication was administered.  Principal Problem: Bipolar disorder, current episode manic severe with psychotic features [facility]) Diagnosis:   Patient Active Problem List   Diagnosis Date Noted  . Bipolar disorder, current episode manic severe with psychotic features (HCC) [F31.2]  08/10/2015  . Cannabis use disorder, severe, dependence (HCC) [F12.20] 08/08/2015   Total Time spent with patient: 30 minutes  Past Psychiatric History: Pt was admitted at [facility] 7/3- 09/17/2013 , [facility] - 6/10- 08/28/13. Pt denies past suicide attempts.Currently he is noncompliant on medications  Past Medical History:  Past Medical History  Diagnosis Date  . Bipolar I disorder (HCC) manic  . Psychosis   . Marihuana dependence (HCC)    History reviewed. No pertinent past surgical history. Family History:  Family History  Problem Relation Age of Onset  . Hypertension Mother   . Depression Father    Family Psychiatric  History: Per mother father has hx of depression and has attempted suicide in the past. Social History: Pt is single, unemployed , was able to hold a job in the past off and on, denies legal issues, lives with his brother in [place]  History  Alcohol Use No     History  Drug Use  . Yes  . Special: Marijuana    Social History   Social History  . Marital Status: Single    Spouse Name: N/A  . Number of Children: N/A  . Years of Education: N/A   Social History Main Topics  . Smoking status: Current Every Day Smoker -- 0.50 packs/day    Types: Cigarettes  . Smokeless tobacco: None  . Alcohol Use: No  . Drug Use: Yes    Special: Marijuana  .  Sexual Activity: Yes    Birth Control/ Protection: None   Other Topics Concern  . None   Social History Narrative   Additional Social History:    Sleep: Fair  Appetite:  Fair  Current Medications: Current Facility-Administered Medications  Medication Dose Route Frequency Provider Last Rate Last Dose  . acetaminophen (TYLENOL) tablet 650 mg  650 mg Oral Q4H PRN Charm Rings, NP   650 mg at 08/16/15 0436  . alum & mag hydroxide-simeth (MAALOX/MYLANTA) 200-200-20 MG/5ML suspension 30 mL  30 mL Oral Q4H PRN Charm Rings, NP      . ARIPiprazole (ABILIFY) tablet 15 mg  15 mg Oral BID Jomarie Longs, MD    15 mg at 08/16/15 0749   Or  . OLANZapine (ZYPREXA) injection 5 mg  5 mg Intramuscular BID Jomarie Longs, MD      . benztropine (COGENTIN) tablet 0.5 mg  0.5 mg Oral BID Jomarie Longs, MD   0.5 mg at 08/16/15 0748  . divalproex (DEPAKOTE ER) 24 hr tablet 750 mg  750 mg Oral QPM Saramma Eappen, MD   750 mg at 08/15/15 1832  . hydrOXYzine (ATARAX/VISTARIL) tablet 25 mg  25 mg Oral BID PC Charm Rings, NP   25 mg at 08/16/15 0908  . LORazepam (ATIVAN) tablet 0.5 mg  0.5 mg Oral BH-q12n4p Jomarie Longs, MD   0.5 mg at 08/15/15 1831   Or  . LORazepam (ATIVAN) injection 0.5 mg  0.5 mg Intramuscular BH-q12n4p Jomarie Longs, MD   0.5 mg at 08/11/15 1528  . LORazepam (ATIVAN) tablet 1 mg  1 mg Oral Q6H PRN Jomarie Longs, MD   1 mg at 08/14/15 1133   Or  . LORazepam (ATIVAN) injection 1 mg  1 mg Intramuscular Q6H PRN Saramma Eappen, MD      . magnesium hydroxide (MILK OF MAGNESIA) suspension 30 mL  30 mL Oral Daily PRN Charm Rings, NP      . nicotine (NICODERM CQ - dosed in mg/24 hours) patch 14 mg  14 mg Transdermal Daily Jomarie Longs, MD   14 mg at 08/16/15 0750  . traZODone (DESYREL) tablet 150 mg  150 mg Oral QHS Charm Rings, NP   150 mg at 08/09/15 2014    Lab Results:  Results for orders placed or performed during the hospital encounter of 08/09/15 (from the past 48 hour(s))  Valproic acid level     Status: None   Collection Time: 08/16/15  6:23 AM  Result Value Ref Range   Valproic Acid Lvl 56 50.0 - 100.0 ug/mL    Comment: Performed at [facility]    Blood Alcohol level:  Lab Results  Component Value Date   [facility] <5 08/08/2015   ETH <11 09/06/2013    Musculoskeletal: Strength & Muscle Tone: within normal limits Gait & Station: normal Patient leans: N/A  Psychiatric Specialty Exam: Physical Exam  Nursing note and vitals reviewed.   Review of Systems  Psychiatric/Behavioral: The patient is nervous/anxious.   All other systems reviewed and are  negative.   Blood pressure 137/70, pulse 88, temperature 97.6 F (36.4 C), temperature source Oral, resp. rate 16, height 5\' 9"  (1.753 m), weight 68.947 kg (152 lb).Body mass index is 22.44 kg/(m^2).  General Appearance: Fairly Groomed  Eye Contact:  Fair  Speech:  Clear and Coherent and Normal Rate  Volume:  Increased  Mood:  Irritable on and off  Affect:  Appropriate and Congruent  Thought Process:  Disorganized and Descriptions of Associations: Circumstantial improving  Orientation:  Other:  person, place  Thought Content:  Logical and Rumination  Suicidal Thoughts:  No but is very manic and disorganized  Homicidal Thoughts:  did not express any  Memory:  Immediate;   Fair Recent;   Fair Remote;   Fair  Judgement:  Intact  Insight:  Present  Psychomotor Activity:  Normal  Concentration:  Concentration: Fair and Attention Span: Good  Recall:  [employer]Fair  Fund of Knowledge:  Fair  Language:  Fair  Akathisia:  No  Handed:  Right  AIMS (if indicated):     Assets:  Social Support  ADL's:  Intact  Cognition:  WNL  Sleep:  Number of Hours: 5   Treatment Plan Summary:Oddis Zenda AlpersWebster is a [age] y old AAM , who is single , unemployed , [place] , who has a hx of Bipolar disorder as well as severe cannabis abuse , [facility] for disorganized , manic behavior. Pt is manic ,and since he were refusing PO medications was started on Forced medication order PER Second opinion obtained by Dr.Cobos. Patient continues to be manic , although progressing .Will continue treatment.  Daily contact with patient to assess and evaluate symptoms and progress in treatment and Medication management   Reviewed past medical records,treatment plan.  Started patient on Forced medication order. Will increase Abilify to 15 mg po BID/Zyprexa 5 mg IM BID if he refuses PO Abilify. Plan to discharge pt on Abilify Maintena IM if he tolerates it.  Will continue  Cogentin  0.5 mg po/IM  bid for EPS.  Will continue  Ativan 0.5 mg PO/IM bid for restlessness. Will increase Depakote ER 1000 mg po. Depakote level on 08/16/15 was 56.  Will make available PRN medications as per agitation protocol. Will continue to monitor vitals ,medication compliance and treatment side effects while patient is here.  Will monitor for medical issues as well as call consult as needed.  Reviewed labs k+ level is low - will replace with Kdur , UDS- pos for THC , TSH- wnl , LIPID PANEL- wnl , HBA1C- wnl , PL - elevated - will need to be monitored ,ekg- qtc - wnl. Collateral information was obtained from mother - sophia Bring - see H&P. CSW will continue  working on disposition.  Patient to participate in therapeutic milieu .   Truman Haywardakia S Starkes, FNP 08/16/2015, 10:40 AM  Reviewed the information documented and agree with the treatment plan.  Christopher Glasscock 08/17/2015 1:41 PM

## 2015-08-16 NOTE — Progress Notes (Signed)
D-pt seemed irritated, c/o the amount of medications he was taking and wants to be discharged A-pt attended group this evening & took his medications R-cont to monitor for safety

## 2015-08-16 NOTE — [facility] Group Notes (Signed)
[facility] Group Notes:  (Clinical Social Work)  08/16/2015  11:00AM-12:00PM  Summary of Progress/Problems:  The main focus of today's process group was to listen to a variety of genres of music and to identify that different types of music provoke different responses.  The patient then was able to identify personally what was soothing for them, as well as energizing, as well as how patient can personally use this knowledge in sleep habits, with depression, and with other symptoms.  The patient expressed at the beginning of group the overall feeling of "fine."  He was talkative at the beginning of group, moving around from chair to chair.  He was irritated at being told to not talk over the top of music.  He danced frequently.  He continued to have conversations with one particular angry male patient, but would quieten when asked to do so.  Type of Therapy:  Music Therapy   Participation Level:  Active  Participation Quality:  Inattentive and Resistant  Affect:  Blunted  Cognitive:  Disorganized  Insight:  Limited  Engagement in Therapy:  Improving  Modes of Intervention:   Activity, Exploration  Ambrose MantleMareida Grossman-Orr, LCSW 08/16/2015

## 2015-08-16 NOTE — [facility] Group Notes (Signed)
[facility] Group Notes:  (Nursing/MHT/Case Management/Adjunct)  Date:  08/16/2015  Time:  0930am  Type of Therapy:  Nurse Education  Participation Level:  Active  Participation Quality:  Inattentive, Monopolizing and Redirectable  Affect:  Anxious  Cognitive:  Disorganized  Insight:  Lacking  Engagement in Group:  Lacking and Off Topic  Modes of Intervention:  Discussion, Education and Support  Summary of Progress/Problems: Patient attended group, responded when prompted but was off-topic and speech was disorganized.  Jonathon BellowsBrittany G Shadrick Senne 08/16/2015, 10:40 AM

## 2015-08-17 MED ORDER — NICOTINE POLACRILEX 2 MG MT GUM
2.0000 mg | CHEWING_GUM | OROMUCOSAL | Status: DC | PRN
  Administered 2015-08-19 – 2015-08-22 (×4): 2 mg via ORAL
  Filled 2015-08-17: qty 1
  Filled 2015-08-17: qty 5
  Filled 2015-08-17 (×3): qty 1

## 2015-08-17 MED ORDER — ARIPIPRAZOLE ER 400 MG IM SUSR
400.0000 mg | INTRAMUSCULAR | Status: DC
  Administered 2015-08-17: 400 mg via INTRAMUSCULAR

## 2015-08-17 NOTE — Progress Notes (Signed)
Recreation Therapy Notes  06.12.2017 approximately 3:30pm. Per MD order LRT met with patient to investigate ways to enhance tx during admission. Patient agitated during encounter, as he was just previously encouraged to take IM medication. LRT attempted to engage patient in assessment interview, however patient hyper-verbal and tangential. Patient additionally spoke in a accent, LRT suspected [place]. Patient alternated between using this accent and regular speaking voice. Patient stated he is from [place], where he lives with his brother. Patient additionally shared his father attempted suicide multiple times in front of him as a child. When asked questions consistent with assessment, patient diverted conversation to suit his desired topic.   LRT will return to continue to attempt to assess patient during admission.   Laureen Ochs Quy Lotts, LRT/CTRS           Omayra Tulloch L 08/17/2015 4:12 PM

## 2015-08-17 NOTE — Plan of Care (Signed)
Problem: Health Behavior/Discharge Planning: Goal: Compliance with treatment plan for underlying cause of condition will improve Outcome: Progressing Bryan Tran has been compliant with medication and has attended group.

## 2015-08-17 NOTE — Progress Notes (Signed)
[facility]  08/17/2015 2:32 PM Bryan Tran Andy  MRN:  [id number]   Subjective:  Patient states "I am fine , I am going to groups."     Objective: Bryan Tran is a [age] y old AAM , who is single , unemployed , [place] , who has a hx of Bipolar disorder as well as severe cannabis abuse , who [facility] for disorganized, manic behavior .  Patient seen and chart reviewed. Discussed patient with treatment team.  Patient today  presents with much clearer and organized thinking,however speech continues to be pressured . Pt is less hyperactive than he used to admission- however per staff continues to need redirection and support. Pt has been tolerating his medications well, continues to take his PO medications , denies ADRS.     Principal Problem: Bipolar disorder, current episode manic severe with psychotic features [facility](HCC) Diagnosis:   Patient Active Problem List   Diagnosis Date Noted  . Bipolar disorder, current episode manic severe with psychotic features (HCC) [F31.2] 08/10/2015  . Cannabis use disorder, severe, dependence (HCC) [F12.20] 08/08/2015   Total Time spent with patient: 30 minutes  Past Psychiatric History: [facility] , [facility] - 6/10- 08/28/13. Pt denies past suicide attempts.Currently he is noncompliant on medications  Past Medical History:  Past Medical History  Diagnosis Date  . Bipolar I disorder (HCC) manic  . Psychosis   . Marihuana dependence (HCC)    History reviewed. No pertinent past surgical history. Family History:  Family History  Problem Relation Age of Onset  . Hypertension Mother   . Depression Father    Family Psychiatric  History: Per mother father has hx of depression and has attempted suicide in the past. Social History: Pt is single, unemployed , was able to hold a job in the past off and on, denies legal issues, lives [place]   History  Alcohol Use No     History  Drug Use  . Yes  . Special: Marijuana    Social History   Social History  . Marital Status: Single    Spouse Name: N/A  . Number of Children: N/A  . Years of Education: N/A   Social History Main Topics  . Smoking status: Current Every Day Smoker -- 0.50 packs/day    Types: Cigarettes  . Smokeless tobacco: None  . Alcohol Use: No  . Drug Use: Yes    Special: Marijuana  . Sexual Activity: Yes    Birth Control/ Protection: None   Other Topics Concern  . None   Social History Narrative   Additional Social History:    Sleep: Fair  Appetite:  Fair  Current Medications: Current Facility-Administered Medications  Medication Dose Route Frequency Provider Last Rate Last Dose  . acetaminophen (TYLENOL) tablet 650 mg  650 mg Oral Q4H PRN Charm RingsJamison Y Lord, NP   650 mg at 08/16/15 0436  . alum & mag hydroxide-simeth (MAALOX/MYLANTA) 200-200-20 MG/5ML suspension 30 mL  30 mL Oral Q4H PRN Charm RingsJamison Y Lord, NP      . ARIPiprazole (ABILIFY) tablet 15 mg  15 mg Oral BID Jomarie LongsSaramma Hugh Garrow, MD   15 mg at 08/17/15 0803   Or  . OLANZapine (ZYPREXA) injection 5 mg  5 mg Intramuscular BID Tahni Porchia, MD      . ARIPiprazole SUSR 400 mg  400 mg Intramuscular Q28 days Jomarie LongsSaramma Jasmond River, MD      .  benztropine (COGENTIN) tablet 0.5 mg  0.5 mg Oral BID Jomarie Longs, MD   0.5 mg at 08/17/15 0803  . divalproex (DEPAKOTE ER) 24 hr tablet 1,000 mg  1,000 mg Oral QPM Truman Hayward, FNP   1,000 mg at 08/16/15 1809  . hydrOXYzine (ATARAX/VISTARIL) tablet 25 mg  25 mg Oral BID PC Charm Rings, NP   25 mg at 08/17/15 0803  . LORazepam (ATIVAN) tablet 0.5 mg  0.5 mg Oral BH-q12n4p Gay Moncivais, MD   0.5 mg at 08/17/15 1155   Or  . LORazepam (ATIVAN) injection 0.5 mg  0.5 mg Intramuscular BH-q12n4p Jomarie Longs, MD   0.5 mg at 08/11/15 1528  . LORazepam (ATIVAN) tablet 1 mg  1 mg Oral Q6H PRN Jomarie Longs, MD   1 mg at 08/14/15 1133   Or  . LORazepam (ATIVAN)  injection 1 mg  1 mg Intramuscular Q6H PRN Karen Kinnard, MD      . magnesium hydroxide (MILK OF MAGNESIA) suspension 30 mL  30 mL Oral Daily PRN Charm Rings, NP      . nicotine (NICODERM CQ - dosed in mg/24 hours) patch 14 mg  14 mg Transdermal Daily Jomarie Longs, MD   14 mg at 08/17/15 0803  . traZODone (DESYREL) tablet 150 mg  150 mg Oral QHS Charm Rings, NP   150 mg at 08/09/15 2014    Lab Results:  Results for orders placed or performed during the hospital encounter of 08/09/15 (from the past 48 hour(s))  Valproic acid level     Status: None   Collection Time: 08/16/15  6:23 AM  Result Value Ref Range   Valproic Acid Lvl 56 50.0 - 100.0 ug/mL    Comment: Performed at [facility]    Blood Alcohol level:  Lab Results  Component Value Date   [facility] <5 08/08/2015   ETH <11 09/06/2013    Musculoskeletal: Strength & Muscle Tone: within normal limits Gait & Station: normal Patient leans: N/A  Psychiatric Specialty Exam: Physical Exam  Nursing note and vitals reviewed.   Review of Systems  Psychiatric/Behavioral: The patient is nervous/anxious.   All other systems reviewed and are negative.   Blood pressure 129/88, pulse 106, temperature 98.4 F (36.9 C), temperature source Oral, resp. rate 16, height 5\' 9"  (1.753 m), weight 68.947 kg (152 lb).Body mass index is 22.44 kg/(m^2).  General Appearance: Fairly Groomed  Eye Contact:  Fair  Speech:  Clear and Coherent and Normal Rate  Volume:  Increased  Mood:  Euphoric   Affect:  Appropriate and Congruent  Thought Process:  Descriptions of Associations: Circumstantial , More organized  Orientation:  Other:  person, place  Thought Content:  Logical and Rumination  Suicidal Thoughts:  No continues to be  manic and irrational   Homicidal Thoughts:  did not express any  Memory:  Immediate;   Fair Recent;   Fair Remote;   Fair  Judgement:  Intact  Insight:  Present  Psychomotor Activity:  Normal   Concentration:  Concentration: Fair and Attention Span: Fair  Recall:  [employer] of Knowledge:  Fair  Language:  Fair  Akathisia:  No  Handed:  Right  AIMS (if indicated):     Assets:  Social Support  ADL's:  Intact  Cognition:  WNL  Sleep:  Number of Hours: 4.75   Treatment Plan Summary:Byan Parenteau is a 17 y old AAM , who is single , unemployed , lives with his  brother in [place] , who has a hx of Bipolar disorder as well as severe cannabis abuse , who presented to [facility] brought in by EMS for disorganized , manic behavior. Pt  was started on Forced medication order PER Second opinion obtained by Dr.Cobos. Patient continues to be manic , although progressing .Will continue treatment.  Daily contact with patient to assess and evaluate symptoms and progress in treatment and Medication management   Reviewed past medical records,treatment plan.  Continue Forced medication order. Will continue  Abilify  15 mg po BID/Zyprexa 5 mg IM BID if he refuses PO Abilify. Will offer Abilify Maintena IM 400 mg - repeat q28 days- first dose today 08/17/15. Will continue  Cogentin 0.5 mg po/IM  bid for EPS.  Will continue  Ativan 0.5 mg PO/IM bid for restlessness. Increased Depakote ER to 1000 mg po. Depakote level on 08/16/15 was 56. Next Depakote level in 4 days . Will make available PRN medications as per agitation protocol. Will continue to monitor vitals ,medication compliance and treatment side effects while patient is here.  Will monitor for medical issues as well as call consult as needed.  Reviewed labs k+ level is low - replaced with Kdur , UDS- pos for THC , TSH- wnl , LIPID PANEL- wnl , HBA1C- wnl , PL - elevated - will need to be monitored ,ekg- qtc - wnl. Collateral information was obtained from mother - sophia Silvers - see H&P. CSW will continue  working on disposition.  Patient to participate in therapeutic milieu .   Krystalynn Ridgeway, MD 08/17/2015, 2:32 PM

## 2015-08-17 NOTE — Progress Notes (Signed)
Adult Psychoeducational Group Note  Date:  08/17/2015 Time:  9:06 PM  Group Topic/Focus:  Wrap-Up Group:   The focus of this group is to help patients review their daily goal of treatment and discuss progress on daily workbooks.  Participation Level:  Active  Participation Quality:  Appropriate  Affect:  Appropriate  Cognitive:  Appropriate  Insight: Appropriate  Engagement in Group:  Engaged  Modes of Intervention:  Discussion  Additional Comments: The patient expressed that he rates today a 8 and attended all groups.The patient also said that group was about community.  Octavio Mannshigpen, Whitney Hillegass Lee 08/17/2015, 9:06 PM

## 2015-08-17 NOTE — [facility] Group Notes (Signed)
[facility] LCSW Group Therapy  08/17/2015 1:15 pm  Type of Therapy: Process Group Therapy  Participation Level:  Active  Participation Quality:  Appropriate  Affect:  Flat  Cognitive:  Oriented  Insight:  Improving  Engagement in Group:  Limited  Engagement in Therapy:  Limited  Modes of Intervention:  Activity, Clarification, Education, Problem-solving and Support  Summary of Progress/Problems: Today's group addressed the issue of overcoming obstacles.  Patients were asked to identify their biggest obstacle post d/c that stands in the way of their on-going success, and then problem solve as to how to manage this. In and out of group several times per usual.  No meaningful contributions to the group, but did have things to say.  Unfortunately, I understood little of it, unless he slowed down and spoke in "[place],"  Which he is sometimes willing to do for me.  Ida Rogueorth, Siyona Coto B 08/17/2015   3:48 PM

## 2015-08-17 NOTE — Progress Notes (Signed)
D:  Patient in the hallway pacing on approach.  Patient is hyper-verbal and tangential.  Patient is calm however.  Patient states he had a good day.  Patient states he does not want to be loaded up with pills.  Patient denies SI/HI and denies AVH.  Patient's uncle and bother visited and patient states it was a good vosit. A: Staff to monitor Q 15 mins for safety.  Encouragement and support offered.  No scheduled medications administered per orders.  Tylenol administered prn for a headache. R: Patient remains safe on the unit.  Patient attended group tonight.  Patient visible on the unit.  Patient refused scheduled Trazodone tonight.

## 2015-08-17 NOTE — Progress Notes (Signed)
D: Bryan Tran was pacing the hallway some at the beginning of the shift. He was observed talking to himself. He complained to this writer about taking medications, but he did so. His speech was pressured. He reported good sleep and denied SI. He has been appropriate with peers and posed no behavior management issue.  A: Meds given as ordered. Q15 safety checks maintained. Support/encouragement offered. R: Pt remains free from harm and continues with treatment. Will continue to monitor for needs/safety.

## 2015-08-17 NOTE — Progress Notes (Signed)
Bryan Tran was resistant to receiving the long-acting Abilify but did so after some cajoling and discussion with this Clinical research associatewriter and Simonne ComeLeo MHT. He tolerated med administration well, but was initially a bit louder after and pacing the hall. Will continue to monitor for needs/safety.

## 2015-08-18 MED ORDER — OXCARBAZEPINE 150 MG PO TABS
150.0000 mg | ORAL_TABLET | Freq: Two times a day (BID) | ORAL | Status: DC
  Administered 2015-08-18 – 2015-08-22 (×8): 150 mg via ORAL
  Filled 2015-08-18 (×12): qty 1

## 2015-08-18 NOTE — Progress Notes (Signed)
DAR NOTE: Pt present with flat affect and depressed mood in the unit. Pt has been observed on the hallway most of time interacting with peers. Pt denies physical pain, took all his meds as scheduled. As per self inventory, pt had a good night sleep, good appetite, normal energy, and good concentration. Pt rate depression at 0, hopeless ness at 0, and anxiety at 0. Pt's safety ensured with 15 minute and environmental checks. Pt currently denies SI/HI and A/V hallucinations. Pt verbally agrees to seek staff if SI/HI or A/VH occurs and to consult with staff before acting on these thoughts. Will continue POC.

## 2015-08-18 NOTE — Progress Notes (Signed)
[facility] MD Progress Note  08/18/2015 12:29 PM Bryan Tran  MRN:  [id number]   Subjective:  Patient states "I am OK, except that you are giving me injections."       Objective: [place] Tran is a 60 y old AAM , who is single , unemployed , lives with his brother in [place] , who has a hx of Bipolar disorder as well as severe cannabis abuse , who presented to [facility] brought in by EMS for disorganized, manic behavior .  Patient seen and chart reviewed. Discussed patient with treatment team.  Patient today continues to be pressured , tangential and loose at times requiring redirection. Pt per staff - seen as pacing often , however is progressing , is more redirectable. Pt has been tolerating his medications well, continues to take his PO medications , denies ADRS.     Principal Problem: Bipolar disorder, current episode manic severe with psychotic features [facility]) Diagnosis:   Patient Active Problem List   Diagnosis Date Noted  . Bipolar disorder, current episode manic severe with psychotic features (HCC) [F31.2] 08/10/2015  . Cannabis use disorder, severe, dependence (HCC) [F12.20] 08/08/2015   Total Time spent with patient: 25 minutes  Past Psychiatric History: Pt was admitted at [facility] 7/3- 09/17/2013 , [facility] - 6/10- 08/28/13. Pt denies past suicide attempts.Currently he is noncompliant on medications  Past Medical History:  Past Medical History  Diagnosis Date  . Bipolar I disorder (HCC) manic  . Psychosis   . Marihuana dependence (HCC)    History reviewed. No pertinent past surgical history. Family History:  Family History  Problem Relation Age of Onset  . Hypertension Mother   . Depression Father    Family Psychiatric  History: Per mother father has hx of depression and has attempted suicide in the past. Social History: Pt is single, unemployed , was able to hold a job in the past off and on, denies legal issues, lives with his brother in [place]  History   Alcohol Use No     History  Drug Use  . Yes  . Special: Marijuana    Social History   Social History  . Marital Status: Single    Spouse Name: N/A  . Number of Children: N/A  . Years of Education: N/A   Social History Main Topics  . Smoking status: Current Every Day Smoker -- 0.50 packs/day    Types: Cigarettes  . Smokeless tobacco: None  . Alcohol Use: No  . Drug Use: Yes    Special: Marijuana  . Sexual Activity: Yes    Birth Control/ Protection: None   Other Topics Concern  . None   Social History Narrative   Additional Social History:    Sleep: Fair  Appetite:  Fair  Current Medications: Current Facility-Administered Medications  Medication Dose Route Frequency Provider Last Rate Last Dose  . acetaminophen (TYLENOL) tablet 650 mg  650 mg Oral Q4H PRN Charm Rings, NP   650 mg at 08/17/15 2100  . alum & mag hydroxide-simeth (MAALOX/MYLANTA) 200-200-20 MG/5ML suspension 30 mL  30 mL Oral Q4H PRN Charm Rings, NP      . ARIPiprazole (ABILIFY) tablet 15 mg  15 mg Oral BID Jomarie Longs, MD   15 mg at 08/18/15 0803   Or  . OLANZapine (ZYPREXA) injection 5 mg  5 mg Intramuscular BID Dahiana Kulak, MD      . ARIPiprazole SUSR 400 mg  400 mg Intramuscular Q28 days Jomarie Longs, MD  400 mg at 08/17/15 1519  . benztropine (COGENTIN) tablet 0.5 mg  0.5 mg Oral BID Jomarie LongsSaramma Curtisha Bendix, MD   0.5 mg at 08/18/15 0803  . divalproex (DEPAKOTE ER) 24 hr tablet 1,000 mg  1,000 mg Oral QPM Truman Haywardakia S Starkes, FNP   1,000 mg at 08/17/15 1643  . hydrOXYzine (ATARAX/VISTARIL) tablet 25 mg  25 mg Oral BID PC Charm RingsJamison Y Lord, NP   25 mg at 08/18/15 0803  . LORazepam (ATIVAN) tablet 0.5 mg  0.5 mg Oral BH-q12n4p Adonia Porada, MD   0.5 mg at 08/18/15 1216   Or  . LORazepam (ATIVAN) injection 0.5 mg  0.5 mg Intramuscular BH-q12n4p Jomarie LongsSaramma Roddie Riegler, MD   0.5 mg at 08/11/15 1528  . LORazepam (ATIVAN) tablet 1 mg  1 mg Oral Q6H PRN Jomarie LongsSaramma Kiaria Quinnell, MD   1 mg at 08/14/15 1133   Or  .  LORazepam (ATIVAN) injection 1 mg  1 mg Intramuscular Q6H PRN Rosezella Kronick, MD      . magnesium hydroxide (MILK OF MAGNESIA) suspension 30 mL  30 mL Oral Daily PRN Charm RingsJamison Y Lord, NP      . nicotine polacrilex (NICORETTE) gum 2 mg  2 mg Oral PRN Jomarie LongsSaramma Jayda White, MD      . OXcarbazepine (TRILEPTAL) tablet 150 mg  150 mg Oral BID Jomarie LongsSaramma Macallan Ord, MD      . traZODone (DESYREL) tablet 150 mg  150 mg Oral QHS Charm RingsJamison Y Lord, NP   150 mg at 08/09/15 2014    Lab Results:  No results found for this or any previous visit (from the past 48 hour(s)).  Blood Alcohol level:  Lab Results  Component Value Date   [facility]   ETH <11 09/06/2013    Musculoskeletal: Strength & Muscle Tone: within normal limits Gait & Station: normal Patient leans: N/A  Psychiatric Specialty Exam: Physical Exam  Nursing note and vitals reviewed.   Review of Systems  Psychiatric/Behavioral: The patient is nervous/anxious.   All other systems reviewed and are negative.   Blood pressure 143/97, pulse 96, temperature 98.2 F (36.8 C), temperature source Oral, resp. rate 18, height 5\' 9"  (1.753 m), weight 68.947 kg (152 lb).Body mass index is 22.44 kg/(m^2).  General Appearance: Fairly Groomed  Eye Contact:  Fair  Speech:  Clear and Coherent and Normal Rate  Volume:  Increased  Mood:  Euphoric   Affect:  Appropriate and Congruent  Thought Process:  Descriptions of Associations: Circumstantial , More organized  Orientation:  Other:  person, place  Thought Content:  Logical and Rumination  Suicidal Thoughts:  No continues to be  manic and irrational   Homicidal Thoughts:  did not express any  Memory:  Immediate;   Fair Recent;   Fair Remote;   Fair  Judgement:  Intact  Insight:  Present  Psychomotor Activity:  Normal  Concentration:  Concentration: Fair and Attention Span: Fair  Recall:  [employer]  Fund of Knowledge:  Fair  Language:  Fair  Akathisia:  No  Handed:  Right  AIMS (if indicated):      Assets:  Social Support  ADL's:  Intact  Cognition:  WNL  Sleep:  Number of Hours: 3.25   Treatment Plan Summary:Brenson Zenda AlpersWebster is a [age] y old AAM , who is single , unemployed , [place] , who has a hx of Bipolar disorder as well as severe cannabis abuse , [facility] for disorganized , manic  behavior. Pt  was started on Forced medication order PER Second opinion obtained by Dr.Cobos. Patient continues to be manic , although progressing .Will continue treatment.  Daily contact with patient to assess and evaluate symptoms and progress in treatment and Medication management   Reviewed past medical records,treatment plan.  Continue Forced medication order. Will continue  Abilify  15 mg po BID/Zyprexa 5 mg IM BID if he refuses PO Abilify. Abilify Maintena IM 400 mg - repeat q28 days- first dose  08/17/15. Will continue  Cogentin 0.5 mg po/IM  bid for EPS.  Will continue  Ativan 0.5 mg PO/IM bid for restlessness. Increased Depakote ER to 1000 mg po. Depakote level on 08/16/15 was 56. Next Depakote level on 08/19/15. Will add Trileptal 150 mg po bid for mood sx- to augment the depakote. Will make available PRN medications as per agitation protocol. Will continue to monitor vitals ,medication compliance and treatment side effects while patient is here.  Will monitor for medical issues as well as call consult as needed.  Reviewed labs k+ level is low - replaced with Kdur , UDS- pos for THC , TSH- wnl , LIPID PANEL- wnl , HBA1C- wnl , PL - elevated - will need to be monitored ,ekg- qtc - wnl. Collateral information was obtained from mother - sophia Neidhardt - see H&P. CSW will continue  working on disposition.  Patient to participate in therapeutic milieu .   Trenisha Lafavor, MD 08/18/2015, 12:29 PM

## 2015-08-18 NOTE — Progress Notes (Signed)
Adult Psychoeducational Group Note  Date:  08/18/2015 Time: 8:15 PM  Group Topic/Focus:  Wrap-Up Group:   The focus of this group is to help patients review their daily goal of treatment and discuss progress on daily workbooks.  Participation Level:  Did Not Attend  Pt was in bed during wrap-up group.    Cleotilde NeerJasmine S Anny Sayler 08/18/2015, 8:41 PM

## 2015-08-18 NOTE — [facility] Group Notes (Signed)
[facility] LCSW Group Therapy  08/18/2015 , 1:27 PM   Type of Therapy:  Group Therapy  Participation Level:  Active  Participation Quality:  Attentive  Affect:  Appropriate  Cognitive:  Alert  Insight:  Improving  Engagement in Therapy:  Engaged  Modes of Intervention:  Discussion, Exploration and Socialization  Summary of Progress/Problems: Today's group focused on the term Diagnosis.  Participants were asked to define the term, and then pronounce whether it is a negative, positive or neutral term.  In and out a couple of times, per usual.  I have learned that when I ask him to speak [place], he will slow down and drop the accent so I can understand.  He is willing to do that, but soon reverts back to his [place] accent again. 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  Interspersed in that was talk of family in [place], the [employer] and his goal of returning there ASAP.  Daryel Geraldorth, Isaih Bulger B 08/18/2015 , 1:27 PM

## 2015-08-19 LAB — VALPROIC ACID LEVEL: VALPROIC ACID LVL: 57 ug/mL (ref 50.0–100.0)

## 2015-08-19 NOTE — Tx Team (Signed)
Interdisciplinary Treatment Plan Update (Adult)  Date:  08/19/2015   Time Reviewed:  10:39 AM   Progress in Treatment: Attending groups: Intermittently Participating in groups: Inappropriately Taking medication as prescribed:  Yes. Tolerating medication:  Yes. Family/Significant other contact made:  Yes Patient understands diagnosis:  No  Limited insight Discussing patient identified problems/goals with staff:  Yes, see initial care plan. Medical problems stabilized or resolved:  Yes. Denies suicidal/homicidal ideation: Yes. Issues/concerns per patient self-inventory:  No. Other:  New problem(s) identified:  Discharge Plan or Barriers: see below  Reason for Continuation of Hospitalization:  Medication stabilization n  Comments:  Patient comes from brother house. He has been rambling like this for three days. Patient has not been taking any of his medicine that he was on. Brother could not remember the medicines that patient is on. Patient has not been sleeping. Brother called EMS due to the rambling.     Pt today continues to be disorganized and delusional as well as manic  Will increase Haldol to 5 mg po bid for psychosis. Will add Cogentin 0.5 mg po bid for EPS. Will add Depakote ER 750 mg po qhs for mood sx- depakote level in 5 days.  08/14/15:  Pt continues to present with mania. Started patient on Forced medication order. Increased Abilify to 10 mg po BID/Zyprexa 5 mg IM BID if he refuses PO Abilify. Plan to discharge pt on Abilify Maintena IM if he tolerates it.  08/19/15: Continue Forced medication order. Will continue Abilify 15 mg po BID/Zyprexa 5 mg IM BID if he refuses PO Abilify. Abilify Maintena IM 400 mg - repeat q28 days- first dose 08/17/15. Will continue Cogentin 0.5 mg po/IM bid for EPS.  Will continue Ativan 0.5 mg PO/IM bid for restlessness. Increased Depakote ER to 1000 mg po. Depakote level on 08/16/15 was 56. Next Depakote level on   Estimated length of  stay: 1-2 days  New goal(s):  Review of initial/current patient goals per problem list:   Review of initial/current patient goals per problem list:  1. Goal(s): Patient will participate in aftercare plan   Met: Yes   Target date: 3-5 days post admission date   As evidenced by: Patient will participate within aftercare plan AEB aftercare provider and housing plan at discharge being identified. 08/11/15:  Return home, follow up outpt      6. Goal (s): Patient will demonstrate decreased signs of mania  * Met: Yes  * Target date: 3-5 days post admission date  * As evidenced by: Patient demonstrate decreased signs of mania AEB decreased mood instability and return to baseline functioning 08/11/15:  Presents with pressured speech, impulsivity, intrusiveness, grandiosity and delusions.  Was not medication compliant prior to admission 08/14/15:  Pt is now taking meds orally after getting injections earlier this week.  However, continues with mood lability, pressured speech, impulsivity 08/19/15:  Has been taking meds consistently, is less pressured, less impulsive and mood is even      Attendees: Patient:  08/19/2015 10:39 AM   Family:   08/19/2015 10:39 AM   Physician:  Ursula Alert, MD 08/19/2015 10:39 AM   Nursing:   Jeanie Cooks, RN 08/19/2015 10:39 AM   CSW:    Roque Lias, LCSW   08/19/2015 10:39 AM   Other:  08/19/2015 10:39 AM   Other:   08/19/2015 10:39 AM   Other:  Lars Pinks, Nurse CM 08/19/2015 10:39 AM   Other:   08/19/2015 10:39 AM   Other:  Earl Many  Angela Adam, [facility]  08/19/2015 10:39 AM   Other:  08/19/2015 10:39 AM   Other:  08/19/2015 10:39 AM   Other:  08/19/2015 10:39 AM   Other:  08/19/2015 10:39 AM   Other:  08/19/2015 10:39 AM   Other:   08/19/2015 10:39 AM    Scribe for Treatment Team:   Trish Mage, 08/19/2015 10:39 AM

## 2015-08-19 NOTE — [facility] Group Notes (Signed)
[facility]  08/19/2015 , 1:25 PM    Type of Therapy:  [facility] Presentation  Participation Level:  Active  Participation Quality:  Attentive  Affect:  Blunted  Cognitive:  Oriented  Insight:  Limited  Engagement in Therapy:  Engaged  Modes of Intervention:  Discussion, Education and Socialization  Summary of Progress/Problems:  Bryan Tran from [facility] came to present his recovery story and play the guitar.  Came initially, stayed for 15 minutes.  Left and did not return.  Bryan Tran, Bryan Tran 08/19/2015 , 1:25 PM

## 2015-08-19 NOTE — Progress Notes (Signed)
D: Pt continues to be very flat and depressed on the unit today. Pt also continues to be very isolative. Pt reported this morning that he was ready for discharge, and that he just wanted to go home. Pt reported that his depression was a 0, his hopelessness was a 0, and that his anxiety was a 0. Pt provided with medications per providers orders. Pt's labs and vitals were monitored throughout the day. Pt supported emotionally and encouraged to express concerns and questions. Pt educated on medications. Pt reported being negative SI/HI, no AH/VH noted. A: 15 min checks continued for patient safety. R: Pts safety maintained.

## 2015-08-19 NOTE — Progress Notes (Signed)
Pt agreed to get his blood drawn, but when pt got to the Tx room , pt stated we wer'ent getting no more his blood. Pt came back to the unit irritated.

## 2015-08-19 NOTE — Progress Notes (Signed)
Patient ID: Bryan Tran, male   DOB: 07-27-1995, 20 y.o.   MRN: [id number] [facility] MD Progress Note  08/19/2015 1:51 PM Bryan Tran  MRN:  [id number]   Subjective:  Bryan Tran reports, "I'm doing fine, good".   Objective: Bryan Tran is a 20 y old AAM, who is single, unemployed, lives with his brother in [place], area, who has a hx of Bipolar disorder as well as severe cannabis abuse, who presented to [facility] brought in by EMS for disorganized, manic behavior .  Patient seen and chart reviewed. Discussed patient with treatment team. Bryan Tran today  Presents with disorganized thinking, speech continues to be pressured & tangential. He is able to make eye contact. Bryan Tran continues to need mental health treatment. He is tolerating his medications well, continues to take his PO medications. Denies any adverse effects or reactions..  Principal Problem: Bipolar disorder, current episode manic severe with psychotic features [facility]) Diagnosis:   Patient Active Problem List   Diagnosis Date Noted  . Bipolar disorder, current episode manic severe with psychotic features (HCC) [F31.2] 08/10/2015  . Cannabis use disorder, severe, dependence (HCC) [F12.20] 08/08/2015   Total Time spent with patient: 15 minutes  Past Psychiatric History: Pt was admitted at [facility] 7/3- 09/17/2013 , [facility] - 6/10- 08/28/13. Pt denies past suicide attempts.Currently he is noncompliant on medications  Past Medical History:  Past Medical History  Diagnosis Date  . Bipolar I disorder (HCC) manic  . Psychosis   . Marihuana dependence (HCC)    History reviewed. No pertinent past surgical history.  Family History:  Family History  Problem Relation Age of Onset  . Hypertension Mother   . Depression Father    Family Psychiatric  History: Per mother father has hx of depression and has attempted suicide in the past. Social History: Pt is single, unemployed , was able to hold a job in the past off and  on, denies legal issues, lives with his brother in [place]  History  Alcohol Use No     History  Drug Use  . Yes  . Special: Marijuana    Social History   Social History  . Marital Status: Single    Spouse Name: N/A  . Number of Children: N/A  . Years of Education: N/A   Social History Main Topics  . Smoking status: Current Every Day Smoker -- 0.50 packs/day    Types: Cigarettes  . Smokeless tobacco: None  . Alcohol Use: No  . Drug Use: Yes    Special: Marijuana  . Sexual Activity: Yes    Birth Control/ Protection: None   Other Topics Concern  . None   Social History Narrative   Additional Social History:  Sleep: Fair  Appetite:  Fair  Current Medications: Current Facility-Administered Medications  Medication Dose Route Frequency Provider Last Rate Last Dose  . acetaminophen (TYLENOL) tablet 650 mg  650 mg Oral Q4H PRN Charm Rings, NP   650 mg at 08/17/15 2100  . alum & mag hydroxide-simeth (MAALOX/MYLANTA) 200-200-20 MG/5ML suspension 30 mL  30 mL Oral Q4H PRN Charm Rings, NP      . ARIPiprazole (ABILIFY) tablet 15 mg  15 mg Oral BID Jomarie Longs, MD   15 mg at 08/19/15 7829   Or  . OLANZapine (ZYPREXA) injection 5 mg  5 mg Intramuscular BID Saramma Eappen, MD      . ARIPiprazole SUSR 400 mg  400 mg Intramuscular Q28 days Jomarie Longs, MD   400 mg at  08/17/15 1519  . benztropine (COGENTIN) tablet 0.5 mg  0.5 mg Oral BID Jomarie Longs, MD   0.5 mg at 08/19/15 0812  . divalproex (DEPAKOTE ER) 24 hr tablet 1,000 mg  1,000 mg Oral QPM Truman Hayward, FNP   1,000 mg at 08/18/15 1716  . hydrOXYzine (ATARAX/VISTARIL) tablet 25 mg  25 mg Oral BID PC Charm Rings, NP   25 mg at 08/19/15 4782  . LORazepam (ATIVAN) tablet 0.5 mg  0.5 mg Oral BH-q12n4p Saramma Eappen, MD   0.5 mg at 08/19/15 1203   Or  . LORazepam (ATIVAN) injection 0.5 mg  0.5 mg Intramuscular BH-q12n4p Jomarie Longs, MD   0.5 mg at 08/11/15 1528  . LORazepam (ATIVAN) tablet 1 mg  1 mg Oral Q6H  PRN Jomarie Longs, MD   1 mg at 08/14/15 1133   Or  . LORazepam (ATIVAN) injection 1 mg  1 mg Intramuscular Q6H PRN Saramma Eappen, MD      . magnesium hydroxide (MILK OF MAGNESIA) suspension 30 mL  30 mL Oral Daily PRN Charm Rings, NP      . nicotine polacrilex (NICORETTE) gum 2 mg  2 mg Oral PRN Jomarie Longs, MD   2 mg at 08/19/15 0914  . OXcarbazepine (TRILEPTAL) tablet 150 mg  150 mg Oral BID Jomarie Longs, MD   150 mg at 08/19/15 0812  . traZODone (DESYREL) tablet 150 mg  150 mg Oral QHS Charm Rings, NP   150 mg at 08/09/15 2014   Lab Results:  No results found for this or any previous visit (from the past 48 hour(s)).  Blood Alcohol level:  Lab Results  Component Value Date   [facility] <5 08/08/2015   ETH <11 09/06/2013   Musculoskeletal: Strength & Muscle Tone: within normal limits Gait & Station: normal Patient leans: N/A  Psychiatric Specialty Exam: Physical Exam  Nursing note and vitals reviewed.   Review of Systems  Psychiatric/Behavioral: The patient is nervous/anxious.   All other systems reviewed and are negative.   Blood pressure 128/88, pulse 70, temperature 97.7 F (36.5 C), temperature source Oral, resp. rate 20, height  (1.753 m), weight 68.947 kg (152 lb).Body mass index is 22.44 kg/(m^2).  General Appearance: Fairly Groomed  Eye Contact:  Fair  Speech:  Clear and Coherent and Normal Rate  Volume:  Increased  Mood:  Euphoric   Affect:  Appropriate and Congruent  Thought Process:  Descriptions of Associations: Circumstantial , More organized  Orientation:  Other:  person, place  Thought Content:  Logical and Rumination  Suicidal Thoughts:  No continues to be  manic and irrational   Homicidal Thoughts:  did not express any  Memory:  Immediate;   Fair Recent;   Fair Remote;   Fair  Judgement:  Intact  Insight:  Present  Psychomotor Activity:  Normal  Concentration:  Concentration: Fair and Attention Span: Fair  Recall:  [employer] of  Knowledge:  Fair  Language:  Fair  Akathisia:  No  Handed:  Right  AIMS (if indicated):     Assets:  Social Support  ADL's:  Intact  Cognition:  WNL  Sleep:  Number of Hours: 0   Treatment Plan Summary: Bryan Tran is a 39 y old AAM , who is single , unemployed , lives with his brother in [place] , who has a hx of Bipolar disorder as well as severe cannabis abuse , who presented to [facility] brought in by EMS for disorganized ,  manic behavior. Pt  was started on Forced medication order PER Second opinion obtained by Dr.Cobos. Patient continues to be manic , although progressing .Will continue treatment.  Daily contact with patient to assess and evaluate symptoms and progress in treatment and Medication management   Reviewed past medical records,treatment plan.  Continue Forced medication order. Will continue  Abilify  15 mg po BID/Zyprexa 5 mg IM BID if he refuses PO Abilify. Will offer Abilify Maintena IM 400 mg - repeat q28 days- first dose today 08/17/15. Will continue  Cogentin 0.5 mg po/IM  bid for EPS.  Will continue  Ativan 0.5 mg PO/IM bid for restlessness. Increased Depakote ER to 1000 mg po. Depakote level on 08/16/15 was 56. Next Depakote level in 4 days . Will make available PRN medications as per agitation protocol. Will continue to monitor vitals ,medication compliance and treatment side effects while patient is here.  Will monitor for medical issues as well as call consult as needed.  Reviewed labs k+ level is low - replaced with Kdur , UDS- pos for THC , TSH- wnl , LIPID PANEL- wnl , HBA1C- wnl , PL - elevated - will need to be monitored ,ekg- qtc - wnl. Collateral information was obtained from mother - sophia Elizondo - see H&P. CSW will continue  working on disposition.  Patient to participate in therapeutic milieu .   Sanjuana KavaNwoko, Agnes I, NP, PMHNP, FNP-BC. 08/19/2015, 1:51 PM  Agree with NP progress note as above

## 2015-08-20 NOTE — [facility] Group Notes (Signed)
[facility] Group Notes:  (Counselor/Nursing/MHT/Case Management/Adjunct)  08/20/2015 1:15PM  Type of Therapy:  Group Therapy  Participation Level:  Active  Participation Quality:  Appropriate  Affect:  Flat  Cognitive:  Oriented  Insight:  Improving  Engagement in Group:  Limited  Engagement in Therapy:  Limited  Modes of Intervention:  Discussion, Exploration and Socialization  Summary of Progress/Problems: The topic for group was balance in life.  Pt participated in the discussion about when their life was in balance and out of balance and how this feels.  Pt discussed ways to get back in balance and short term goals they can work on to get where they want to be.  In and out several times, per usual.  "I'm balanced, I'm always balanced. Every time you see me I am balanced.  Even when I am at home and not taking meds and smoking kush, I am balanced."  Says he finds balance by getting iced coffee at [employer].  Ida Rogueorth, Ailyne Pawley B 08/20/2015 4:33 PM

## 2015-08-20 NOTE — Progress Notes (Signed)
Patient ID: Bryan Tran, male   DOB: April 12, 1995, 20 y.o.   MRN: [id number] Patient ID: Bryan Tran, male   DOB: April 12, 1995, 20 y.o.   MRN: [id number] [facility] Progress Note  08/20/2015 1:29 PM Bryan Tran  MRN:  [id number]   Subjective:  Bryan Tran reports, "I'm doing fine, I slept well last night".  Objective: Bryan Tran is a [age] y old AAM, who is single, unemployed, lives with his brother in [place], area, who has a hx of Bipolar disorder as well as severe cannabis abuse, who [facility] for disorganized, manic behavior .  Patient seen and chart reviewed. Discussed patient with treatment team. Bryan LarveEmmanuel today  Presents with disorganized thinking, speech continues to be pressured & tangential. He is able to make eye contact. He paces around his room & within the hallways. He is not disruptive on the unit. Bryan Tran continues to need mental health treatment. He is tolerating his medications well. Denies any adverse effects or reactions. He says when he gets discharge from the hospital, he will be living with his uncle & will go back to college to study everything.  Principal Problem: Bipolar disorder, current episode manic severe with psychotic features [facility](HCC) Diagnosis:   Patient Active Problem List   Diagnosis Date Noted  . Bipolar disorder, current episode manic severe with psychotic features (HCC) [F31.2] 08/10/2015  . Cannabis use disorder, severe, dependence (HCC) [F12.20] 08/08/2015   Total Time spent with patient: 15 minutes  Past Psychiatric History: [facility] , [facility] - 6/10- 08/28/13. Pt denies past suicide attempts.Currently he is noncompliant on medications  Past Medical History:  Past Medical History  Diagnosis Date  . Bipolar I disorder (HCC) manic  . Psychosis   . Marihuana dependence (HCC)    History reviewed. No pertinent past surgical history.  Family History:  Family History  Problem  Relation Age of Onset  . Hypertension Mother   . Depression Father    Family Psychiatric  History: Per mother father has hx of depression and has attempted suicide in the past. Social History: Pt is single, unemployed , was able to hold a job in the past off and on, denies legal issues, lives with his brother in [place]  History  Alcohol Use No     History  Drug Use  . Yes  . Special: Marijuana    Social History   Social History  . Marital Status: Single    Spouse Name: N/A  . Number of Children: N/A  . Years of Education: N/A   Social History Main Topics  . Smoking status: Current Every Day Smoker -- 0.50 packs/day    Types: Cigarettes  . Smokeless tobacco: None  . Alcohol Use: No  . Drug Use: Yes    Special: Marijuana  . Sexual Activity: Yes    Birth Control/ Protection: None   Other Topics Concern  . None   Social History Narrative   Additional Social History:  Sleep: Fair  Appetite:  Fair  Current Medications: Current Facility-Administered Medications  Medication Dose Route Frequency Provider Last Rate Last Dose  . acetaminophen (TYLENOL) tablet 650 mg  650 mg Oral Q4H PRN Charm RingsJamison Y Lord, NP   650 mg at 08/17/15 2100  . alum & mag hydroxide-simeth (MAALOX/MYLANTA) 200-200-20 MG/5ML suspension 30 mL  30 mL Oral Q4H PRN Charm RingsJamison Y Lord, NP      . ARIPiprazole (ABILIFY) tablet 15 mg  15 mg Oral  BID Jomarie Longs, MD   15 mg at 08/20/15 0830   Or  . OLANZapine (ZYPREXA) injection 5 mg  5 mg Intramuscular BID Jomarie Longs, MD      . ARIPiprazole SUSR 400 mg  400 mg Intramuscular Q28 days Jomarie Longs, MD   400 mg at 08/17/15 1519  . benztropine (COGENTIN) tablet 0.5 mg  0.5 mg Oral BID Jomarie Longs, MD   0.5 mg at 08/20/15 0830  . divalproex (DEPAKOTE ER) 24 hr tablet 1,000 mg  1,000 mg Oral QPM Truman Hayward, FNP   1,000 mg at 08/19/15 1658  . hydrOXYzine (ATARAX/VISTARIL) tablet 25 mg  25 mg Oral BID PC Charm Rings, NP   25 mg at 08/20/15 0830  .  LORazepam (ATIVAN) tablet 0.5 mg  0.5 mg Oral BH-q12n4p Saramma Eappen, MD   0.5 mg at 08/20/15 1156   Or  . LORazepam (ATIVAN) injection 0.5 mg  0.5 mg Intramuscular BH-q12n4p Jomarie Longs, MD   0.5 mg at 08/11/15 1528  . LORazepam (ATIVAN) tablet 1 mg  1 mg Oral Q6H PRN Jomarie Longs, MD   1 mg at 08/14/15 1133   Or  . LORazepam (ATIVAN) injection 1 mg  1 mg Intramuscular Q6H PRN Saramma Eappen, MD      . magnesium hydroxide (MILK OF MAGNESIA) suspension 30 mL  30 mL Oral Daily PRN Charm Rings, NP      . nicotine polacrilex (NICORETTE) gum 2 mg  2 mg Oral PRN Jomarie Longs, MD   2 mg at 08/19/15 0914  . OXcarbazepine (TRILEPTAL) tablet 150 mg  150 mg Oral BID Jomarie Longs, MD   150 mg at 08/20/15 0831  . traZODone (DESYREL) tablet 150 mg  150 mg Oral QHS Charm Rings, NP   150 mg at 08/09/15 2014   Lab Results:  Results for orders placed or performed during the hospital encounter of 08/09/15 (from the past 48 hour(s))  Valproic acid level     Status: None   Collection Time: 08/19/15  6:29 PM  Result Value Ref Range   Valproic Acid Lvl 57 50.0 - 100.0 ug/mL    Comment: Performed at [facility]    Blood Alcohol level:  Lab Results  Component Value Date   [facility] <5 08/08/2015   ETH <11 09/06/2013   Musculoskeletal: Strength & Muscle Tone: within normal limits Gait & Station: normal Patient leans: N/A  Psychiatric Specialty Exam: Physical Exam  Nursing note and vitals reviewed.   Review of Systems  Psychiatric/Behavioral: The patient is nervous/anxious.   All other systems reviewed and are negative.   Blood pressure 126/82, pulse 62, temperature 97.7 F (36.5 C), temperature source Oral, resp. rate 18, height 5\' 9"  (1.753 m), weight 68.947 kg (152 lb).Body mass index is 22.44 kg/(m^2).  General Appearance: Fairly Groomed  Eye Contact:  Fair  Speech:  Pressured, sometimes will speak in a different language other than [place].   Volume:  Increased   Mood:  Euphoric   Affect:  Appropriate and Congruent  Thought Process:  Descriptions of Associations: Circumstantial  disorganized  Orientation:  Other:  person, place  Thought Content:  Logical and Rumination  Suicidal Thoughts:  No, continues to be  manic and irrational   Homicidal Thoughts:  did not express any  Memory:  Immediate;   Fair Recent;   Fair Remote;   Fair  Judgement:  Intact  Insight:  Present  Psychomotor Activity:  Normal  Concentration:  Concentration: Fair and Attention Span: Fair  Recall:  [employer] of Knowledge:  Fair  Language:  Fair  Akathisia:  No  Handed:  Right  AIMS (if indicated):     Assets:  Social Support  ADL's:  Intact  Cognition:  WNL  Sleep:  Number of Hours: 0   Treatment Plan Summary: Dameir Gentzler is a 59 y old AAM , who is single , unemployed , lives with his brother in [place] , who has a hx of Bipolar disorder as well as severe cannabis abuse , who presented to [facility] brought in by EMS for disorganized , manic behavior. Pt  was started on Forced medication order PER Second opinion obtained by Dr.Cobos. Patient continues to be manic , although progressing .Will continue treatment.  Daily contact with patient to assess and evaluate symptoms and progress in treatment and Medication management: Reviewed past medical records,treatment plan.  Continue Forced medication order. Will continue  Abilify  15 mg po BID/Zyprexa 5 mg IM BID if he refuses PO Abilify. Will offer Abilify Maintena IM 400 mg - repeat q28 days- first dose today 08/17/15. Will continue  Cogentin 0.5 mg po/IM  bid for EPS.  Will continue  Ativan 0.5 mg PO/IM bid for restlessness. Increased Depakote ER to 1000 mg po. Depakote level on 08/16/15 was 56. Next Depakote level in 4 days . Will make available PRN medications as per agitation protocol. Will continue to monitor vitals ,medication compliance and treatment side effects while patient is here.  Will monitor for medical issues  as well as call consult as needed.  Collateral information was obtained from mother - sophia Delker - see H&P. CSW will continue  working on disposition.  Patient to participate in therapeutic milieu.  Continue current plan of care.  Sanjuana Kava, NP, PMHNP, FNP-BC. 08/20/2015, 1:29 PM  Agree with NP progress note as above

## 2015-08-20 NOTE — Progress Notes (Signed)
Patient ID: Bryan Tran, male   DOB: 25-Aug-1995, 20 y.o.   MRN: [id number] D: Client visible on the unit walking in the hall and at nurses station. Client is tangential with rapid, pressured speech. Some statements concrete "my uncle came yesterday" "I will be going to [place] with my brother, then with my uncle to [place], I don't know for sure my brother coming tomorrow, you have to ask my brother" Client reports not problems. A: Writer reviewed medications. Client refused Trazodone, "No trazodone, I don't take that" Staff will monitor q115min for safety. R: client is safe on the unit.

## 2015-08-20 NOTE — Progress Notes (Signed)
  D: Writer attempted to talk to the pt. Pt was walking away from the writer when Retail bankerthe writer called his name. Pt turned around and shouted "what", mumbled, and continued to walk away. Pt spoke with the writer briefly and discussed his visit, as well as how he feels about his mother and uncle. Stated he'll do anything or Anything for his uncle and cousins, but that his mother and father Pt has no questions or concerns.    A:  Support and encouragement was offered. 15 min checks continued for safety.  R: Pt remains safe.

## 2015-08-20 NOTE — Progress Notes (Signed)
DAR NOTE: Patient presents with anxious affect and depressed mood.  Denies pain, auditory and visual hallucinations.  Rates depression at 0, hopelessness at 0, and anxiety at 0.  Maintained on routine safety checks.  Medications given as prescribed.  Support and encouragement offered as needed.  Attended group and participated.  States goal for today is "discharge."  Patient observed responding to internal stimuli.  Offered no complaint. Patient preoccupied with discharge and verbalizes readiness for discharge.

## 2015-08-21 NOTE — Progress Notes (Signed)
Patient ID: Bryan Tran, male   DOB: [birth year], 20 y.o.   MRN: [id number] Patient ID: Bryan Tran, male   DOB: [birth year], 20 y.o.   MRN: [id number] Patient ID: Bryan Tran, male   DOB: [birth year], 20 y.o.   MRN: [id number] [facility]  08/21/2015 2:01 PM Bryan Tran  MRN:  [id number]   Subjective:  Bryan Tran reports, "I'm doing fine. Everything is fine".  Objective: Bryan Tran is a [age] y old AAM, who is single, unemployed, [place], who has a hx of Bipolar disorder as well as severe cannabis abuse, who [facility] for disorganized, manic behavior .  Patient seen and chart reviewed. Discussed patient with treatment team. Bryan LarveEmmanuel today  Presents with improved & organized thought & speech. He is able to make eye contact. He still paces around his room & within the hallways. He is not disruptive on the unit. Bryan Tran overall mental health seem to have improved quite much from working with him over the last 3 days. He is tolerating his medications well. Denies any adverse effects or reactions. He says when he gets discharge from the hospital, he will be living with his uncle & will go back to college to study everything. 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  Principal Problem: Bipolar disorder, current episode manic severe with psychotic features [facility](HCC) Diagnosis:   Patient Active Problem List   Diagnosis Date Noted  . Bipolar disorder, current episode manic severe with psychotic features (HCC) [F31.2] 08/10/2015  . Cannabis use disorder, severe, dependence (HCC) [F12.20] 08/08/2015   Total Time spent with patient: 15 minutes  Past Psychiatric History: [facility] , [facility] - 6/10- 08/28/13. Pt denies past suicide attempts.Currently he is noncompliant on medications  Past Medical History:  Past  Medical History  Diagnosis Date  . Bipolar I disorder (HCC) manic  . Psychosis   . Marihuana dependence (HCC)    History reviewed. No pertinent past surgical history.  Family History:  Family History  Problem Relation Age of Onset  . Hypertension Mother   . Depression Father    Family Psychiatric  History: Per mother father has hx of depression and has attempted suicide in the past. Social History: Pt is single, unemployed , was able to hold a job in the past off and on, denies legal issues, [place]  History  Alcohol Use No     History  Drug Use  . Yes  . Special: Marijuana    Social History   Social History  . Marital Status: Single    Spouse Name: N/A  . Number of Children: N/A  . Years of Education: N/A   Social History Main Topics  . Smoking status: Current Every Day Smoker -- 0.50 packs/day    Types: Cigarettes  . Smokeless tobacco: None  . Alcohol Use: No  . Drug Use: Yes    Special: Marijuana  . Sexual Activity: Yes    Birth Control/ Protection: None   Other Topics Concern  . None   Social History Narrative   Additional Social History:  Sleep: Fair  Appetite:  Fair  Current Medications: Current Facility-Administered Medications  Medication Dose Route Frequency Provider Last Rate Last Dose  . acetaminophen (TYLENOL) tablet 650 mg  650 mg Oral Q4H PRN Charm RingsJamison Y Lord, NP  650 mg at 08/17/15 2100  . alum & mag hydroxide-simeth (MAALOX/MYLANTA) 200-200-20 MG/5ML suspension 30 mL  30 mL Oral Q4H PRN Charm Rings, NP      . ARIPiprazole (ABILIFY) tablet 15 mg  15 mg Oral BID Jomarie Longs, MD   15 mg at 08/21/15 1610   Or  . OLANZapine (ZYPREXA) injection 5 mg  5 mg Intramuscular BID Jomarie Longs, MD      . ARIPiprazole SUSR 400 mg  400 mg Intramuscular Q28 days Jomarie Longs, MD   400 mg at 08/17/15 1519  . benztropine (COGENTIN) tablet 0.5 mg  0.5 mg Oral BID Jomarie Longs, MD   0.5 mg at 08/21/15 0812  . divalproex  (DEPAKOTE ER) 24 hr tablet 1,000 mg  1,000 mg Oral QPM Truman Hayward, FNP   1,000 mg at 08/20/15 1703  . hydrOXYzine (ATARAX/VISTARIL) tablet 25 mg  25 mg Oral BID PC Charm Rings, NP   25 mg at 08/21/15 9604  . LORazepam (ATIVAN) tablet 0.5 mg  0.5 mg Oral BH-q12n4p Saramma Eappen, MD   0.5 mg at 08/21/15 1149   Or  . LORazepam (ATIVAN) injection 0.5 mg  0.5 mg Intramuscular BH-q12n4p Jomarie Longs, MD   0.5 mg at 08/11/15 1528  . LORazepam (ATIVAN) tablet 1 mg  1 mg Oral Q6H PRN Jomarie Longs, MD   1 mg at 08/14/15 1133   Or  . LORazepam (ATIVAN) injection 1 mg  1 mg Intramuscular Q6H PRN Saramma Eappen, MD      . magnesium hydroxide (MILK OF MAGNESIA) suspension 30 mL  30 mL Oral Daily PRN Charm Rings, NP      . nicotine polacrilex (NICORETTE) gum 2 mg  2 mg Oral PRN Jomarie Longs, MD   2 mg at 08/21/15 0842  . OXcarbazepine (TRILEPTAL) tablet 150 mg  150 mg Oral BID Jomarie Longs, MD   150 mg at 08/21/15 0812  . traZODone (DESYREL) tablet 150 mg  150 mg Oral QHS Charm Rings, NP   150 mg at 08/09/15 2014   Lab Results:  Results for orders placed or performed during the hospital encounter of 08/09/15 (from the past 48 hour(s))  Valproic acid level     Status: None   Collection Time: 08/19/15  6:29 PM  Result Value Ref Range   Valproic Acid Lvl 57 50.0 - 100.0 ug/mL    Comment: Performed at [facility]   Blood Alcohol level:  Lab Results  Component Value Date   [facility] <5 08/08/2015   ETH <11 09/06/2013   Musculoskeletal: Strength & Muscle Tone: within normal limits Gait & Station: normal Patient leans: N/A  Psychiatric Specialty Exam: Physical Exam  Nursing note and vitals reviewed.   Review of Systems  Psychiatric/Behavioral: The patient is nervous/anxious.   All other systems reviewed and are negative.   Blood pressure 134/99, pulse 78, temperature 98 F (36.7 C), temperature source Oral, resp. rate 16, height  (1.753 m), weight 68.947 kg  (152 lb).Body mass index is 22.44 kg/(m^2).  General Appearance: Fairly Groomed  Eye Contact:  Fair  Speech:  Pressured, sometimes will speak in a different language other than [place].   Volume:  Increased  Mood:  Euphoric   Affect:  Appropriate and Congruent  Thought Process:  Descriptions of Associations: Circumstantial  disorganized  Orientation:  Other:  person, place  Thought Content:  Logical and Rumination  Suicidal Thoughts:  No, continues to be  manic and  irrational   Homicidal Thoughts:  did not express any  Memory:  Immediate;   Fair Recent;   Fair Remote;   Fair  Judgement:  Intact  Insight:  Present  Psychomotor Activity:  Normal  Concentration:  Concentration: Fair and Attention Span: Fair  Recall:  [employer] of Knowledge:  Fair  Language:  Fair  Akathisia:  No  Handed:  Right  AIMS (if indicated):     Assets:  Social Support  ADL's:  Intact  Cognition:  WNL  Sleep:  Number of Hours: 3.25   Treatment Plan Summary: Traevion Poehler is a 59 y old AAM , who is single , unemployed , lives with his brother in [place] , who has a hx of Bipolar disorder as well as severe cannabis abuse , who presented to [facility] brought in by EMS for disorganized , manic behavior. Pt  was started on Forced medication order PER Second opinion obtained by Dr.Cobos. Patient continues to be manic , although progressing .Will continue treatment.  Daily contact with patient to assess and evaluate symptoms and progress in treatment and Medication management: Reviewed past medical records,treatment plan.  Continue Forced medication order. Will continue  Abilify  15 mg po BID/Zyprexa 5 mg IM BID if he refuses PO Abilify. Will offer Abilify Maintena IM 400 mg - repeat q28 days- first dose today 08/17/15. Will continue  Cogentin 0.5 mg po/IM  bid for EPS.  Will continue  Ativan 0.5 mg PO/IM bid for restlessness. Increased Depakote ER to 1000 mg po. Depakote level on 08/16/15 was 56. Next Depakote level in  4 days . Will make available PRN medications as per agitation protocol. Will continue to monitor vitals ,medication compliance and treatment side effects while patient is here.  Will monitor for medical issues as well as call consult as needed.  Collateral information was obtained from mother - sophia Yeske - see H&P. CSW will continue  working on disposition.  Patient to participate in therapeutic milieu.  Continue current plan of care.  Sanjuana Kava, NP, PMHNP, FNP-BC. 08/21/2015, 2:01 PM  Agree with NP progress note as above

## 2015-08-21 NOTE — [facility] Group Notes (Signed)
[facility]   08/21/2015 11:35 AM  Participation Quality:  Distracted  Mood/Affect:  Defensive  Depression Rating:    Anxiety Rating:    Thoughts of Suicide:  No Will you contract for safety?   NA  Current AVH:  denies  Plan for Discharge/Comments:  In and out of group several times, per usual.  Pressured speech, difficult to redirect.  Just rambles on about whatever is on his mind.  Transportation Means:   Supports:  Daryel GeraldNorth, Norm Wray B

## 2015-08-21 NOTE — Progress Notes (Signed)
DAR NOTE: Patient presents with anxious mood and affect. Denies pain, auditory and visual hallucinations.  Rates depression at 0, hopelessness at 0, and anxiety at 0.  Maintained on routine safety checks.  Medications given as prescribed.  Support and encouragement offered as needed.  Attended group and participated.  States goal for today is "discharge."  Patient observed socializing with peers in the dayroom.  Offered no complaint.

## 2015-08-21 NOTE — Progress Notes (Signed)
DAR NOTE: Patient presents with anxious affect and mood.  Denies auditory and visual hallucinations.  Rates depression at 0, hopelessness at 0, and anxiety at 0.  Described energy level as normal and concentration as good.  Maintained on routine safety checks.  Medications given as prescribed.  Support and encouragement offered as needed.  Attended group and participated.  States goal for today is "discharge."  Patient observed socializing with peers in the dayroom.  Patient requested and received Tylenol 650 mg for complain of headache with good effect.

## 2015-08-21 NOTE — [facility] Group Notes (Signed)
[facility] LCSW Group Therapy  08/21/2015  1:05 PM  Type of Therapy:  Group therapy  Participation Level:  Active  Participation Quality:  Attentive  Affect:  Flat  Cognitive:  Oriented  Insight:  Limited  Engagement in Therapy:  Limited  Modes of Intervention:  Discussion, Socialization  Summary of Progress/Problems:  Chaplain was here to lead a group on themes of hope and courage. "I hope to see my grandmother again.  I hope her memory is still good."  Interjected many times throughout the group.  Vaguely related to current conversation, but only if one worked hard to make the connection.  Came across more as flight of ideas. Daryel Geraldorth, Tyresse Jayson B 08/21/2015 1:04 PM

## 2015-08-22 ENCOUNTER — Encounter (HOSPITAL_COMMUNITY): Payer: Self-pay | Admitting: Registered Nurse

## 2015-08-22 MED ORDER — OXCARBAZEPINE 150 MG PO TABS: 150.0000 mg | ORAL_TABLET | Freq: Two times a day (BID) | ORAL | Status: AC

## 2015-08-22 MED ORDER — TRAZODONE HCL 150 MG PO TABS: 150.0000 mg | ORAL_TABLET | Freq: Every day | ORAL | Status: AC

## 2015-08-22 MED ORDER — DIVALPROEX SODIUM ER 500 MG PO TB24: 1000.0000 mg | ORAL_TABLET | Freq: Every evening | ORAL | Status: AC

## 2015-08-22 MED ORDER — ARIPIPRAZOLE ER 400 MG IM SUSR: 400.0000 mg | INTRAMUSCULAR | Status: AC

## 2015-08-22 MED ORDER — BENZTROPINE MESYLATE 0.5 MG PO TABS: 0.5000 mg | ORAL_TABLET | Freq: Two times a day (BID) | ORAL | Status: AC

## 2015-08-22 NOTE — Progress Notes (Signed)
[facility] MD Progress Note  08/22/2015 1:26 PM Nick Stults  MRN:  [id number]   Subjective:  Patient reports, "I'm okay and you?".  Objective:Erice Hinderliter is awake, alert and oriented to person and place. Patient seen walking the halls responding to internal stimuli.   Denies suicidal or homicidal ideation. Denies auditory or visual hallucination. Patient reports he is medication compliant without mediation side effects. However states if he doesn't go home today "I am not taking anymore medications" . Denies depression or depressive symptoms at this time.  states that he going to live with his brother. Patient was consider for possible discharge, Psychiatry to reevaluate on Monday. Reports good appetite and states he is resting well. Support, encouragement and reassurance was provided.   Principal Problem: Bipolar disorder, current episode manic severe with psychotic features [facility]) Diagnosis:   Patient Active Problem List   Diagnosis Date Noted  . Bipolar disorder, current episode manic severe with psychotic features (HCC) [F31.2] 08/10/2015  . Cannabis use disorder, severe, dependence (HCC) [F12.20] 08/08/2015   Total Time spent with patient: 15 minutes  Past Psychiatric History: Pt was admitted at [facility] 7/3- 09/17/2013 , [facility] - 6/10- 08/28/13. Pt denies past suicide attempts.Currently he is noncompliant on medications  Past Medical History:  Past Medical History  Diagnosis Date  . Bipolar I disorder (HCC) manic  . Psychosis   . Marihuana dependence (HCC)    History reviewed. No pertinent past surgical history.  Family History:  Family History  Problem Relation Age of Onset  . Hypertension Mother   . Depression Father    Family Psychiatric  History: Per mother father has hx of depression and has attempted suicide in the past. Social History: Pt is single, unemployed , was able to hold a job in the past off and on, denies legal issues, lives with his brother in [place]   History  Alcohol Use No     History  Drug Use  . Yes  . Special: Marijuana    Social History   Social History  . Marital Status: Single    Spouse Name: N/A  . Number of Children: N/A  . Years of Education: N/A   Social History Main Topics  . Smoking status: Current Every Day Smoker -- 0.50 packs/day    Types: Cigarettes  . Smokeless tobacco: None  . Alcohol Use: No  . Drug Use: Yes    Special: Marijuana  . Sexual Activity: Yes    Birth Control/ Protection: None   Other Topics Concern  . None   Social History Narrative   Additional Social History:  Sleep: Fair  Appetite:  Fair  Current Medications: Current Facility-Administered Medications  Medication Dose Route Frequency Provider Last Rate Last Dose  . acetaminophen (TYLENOL) tablet 650 mg  650 mg Oral Q4H PRN Charm Rings, NP   650 mg at 08/21/15 1635  . alum & mag hydroxide-simeth (MAALOX/MYLANTA) 200-200-20 MG/5ML suspension 30 mL  30 mL Oral Q4H PRN Charm Rings, NP      . ARIPiprazole (ABILIFY) tablet 15 mg  15 mg Oral BID Jomarie Longs, MD   15 mg at 08/22/15 0740   Or  . OLANZapine (ZYPREXA) injection 5 mg  5 mg Intramuscular BID Jomarie Longs, MD      . ARIPiprazole SUSR 400 mg  400 mg Intramuscular Q28 days Jomarie Longs, MD   400 mg at 08/17/15 1519  . benztropine (COGENTIN) tablet 0.5 mg  0.5 mg Oral BID Jomarie Longs, MD  0.5 mg at 08/22/15 0740  . divalproex (DEPAKOTE ER) 24 hr tablet 1,000 mg  1,000 mg Oral QPM Truman Haywardakia S Starkes, FNP   1,000 mg at 08/20/15 1703  . hydrOXYzine (ATARAX/VISTARIL) tablet 25 mg  25 mg Oral BID PC Charm RingsJamison Y Lord, NP   25 mg at 08/22/15 0816  . LORazepam (ATIVAN) tablet 0.5 mg  0.5 mg Oral BH-q12n4p Saramma Eappen, MD   0.5 mg at 08/22/15 1112   Or  . LORazepam (ATIVAN) injection 0.5 mg  0.5 mg Intramuscular BH-q12n4p Jomarie LongsSaramma Eappen, MD   0.5 mg at 08/11/15 1528  . LORazepam (ATIVAN) tablet 1 mg  1 mg Oral Q6H PRN Jomarie LongsSaramma Eappen, MD   1 mg at 08/14/15 1133   Or  .  LORazepam (ATIVAN) injection 1 mg  1 mg Intramuscular Q6H PRN Saramma Eappen, MD      . magnesium hydroxide (MILK OF MAGNESIA) suspension 30 mL  30 mL Oral Daily PRN Charm RingsJamison Y Lord, NP      . nicotine polacrilex (NICORETTE) gum 2 mg  2 mg Oral PRN Jomarie LongsSaramma Eappen, MD   2 mg at 08/22/15 1112  . OXcarbazepine (TRILEPTAL) tablet 150 mg  150 mg Oral BID Jomarie LongsSaramma Eappen, MD   150 mg at 08/22/15 0740  . traZODone (DESYREL) tablet 150 mg  150 mg Oral QHS Charm RingsJamison Y Lord, NP   150 mg at 08/09/15 2014   Lab Results:  No results found for this or any previous visit (from the past 48 hour(s)). Blood Alcohol level:  Lab Results  Component [facility] Date   [facility]   ETH <11 09/06/2013   Musculoskeletal: Strength & Muscle Tone: within normal limits Gait & Station: normal Patient leans: N/A  Psychiatric Specialty Exam: Physical Exam  Nursing note and vitals reviewed. Constitutional: He appears well-developed.  HENT:  Head: Normocephalic.  Neck: Normal range of motion.  Cardiovascular: Normal rate.   Musculoskeletal: Normal range of motion.  Neurological: He is alert.  Skin: Skin is warm.  Psychiatric: He has a normal mood and affect. His behavior is normal.    Review of Systems  Psychiatric/Behavioral: Negative for depression, suicidal ideas and hallucinations. The patient is nervous/anxious and has insomnia.   All other systems reviewed and are negative.   Blood pressure 133/77, pulse 79, temperature 97.8 F (36.6 C), temperature source Oral, resp. rate 18, height 5\' 9"  (1.753 m), weight 68.947 kg (152 lb).Body mass index is 22.44 kg/(m^2).  General Appearance: Fairly Groomed  Eye Contact:  Fair  Speech:  Pressured, sometimes will speak in a different language other than [place].   Volume:  Increased  Mood:  Euphoric   Affect:  Appropriate and Congruent  Thought Process:  Descriptions of Associations: Circumstantial  disorganized  Orientation:  Other:  person, place  Thought  Content:  Logical and Rumination  Suicidal Thoughts:  No,   Homicidal Thoughts:  did not express any  Memory:  Immediate;   Fair Recent;   Fair Remote;   Fair  Judgement:  Intact  Insight:  Present  Psychomotor Activity:  Normal  Concentration:  Concentration: Fair and Attention Span: Fair  Recall:  [employer]  Fund of Knowledge:  Fair  Language:  Fair  Akathisia:  No  Handed:  Right  AIMS (if indicated):     Assets:  Social Support  ADL's:  Intact  Cognition:  WNL  Sleep:  Number of Hours: 4.25     I agree with current treatment plan on 08/22/2015, Patient  seen face-to-face for psychiatric evaluation follow-up, chart reviewed and case discussed with the MD Gilmore Laroche. Reviewed the information documented and agree with the treatment plan. Discharge with brother Danyael Alipio. Brother Marquita Palms) states this is patient baseline and feels comfortable taking his brother home. Patient  to follow-up with  [facility] and ACT-team.    Treatment see note below  Oneta Rack, NP 08/22/2015, 1:26 PM  Addendum : Patient family wanted to pick him today. He is at baseline and after review with staff and notes. Patient will be discharged and family picking him up. Follow up with recommendations and medications and appointments.

## 2015-08-22 NOTE — Progress Notes (Signed)
Patient discharged to lobby. Patient was stable and appreciative at that time. All papers and prescriptions were given and valuables returned. Verbal understanding expressed. Denies SI/HI and A/VH. Patient given opportunity to express concerns and ask questions.  

## 2015-08-22 NOTE — Progress Notes (Signed)
Patient ID: Bryan Tran, male   DOB: 1996/02/08, 20 y.o.   MRN: [id number] D: Client was in bed this shift up to BR and seen in hall after bedtime. No complaints verbalized. Client appeared subdued, somnolent. A: Writer initiated conversation, client reluctant to talk, speaks and goes back to his room. Staff will monitor q1915min for safety. R: Client is safe on the unit.

## 2015-08-22 NOTE — Discharge Summary (Signed)
Physician Discharge Summary Note  Patient:  Bryan Tran is an 20 y.o., male MRN:  [id number] DOB:  07-Aug-1995 Patient phone:  [phone] (home)  Patient address:   [address] [place] [id number],  Total Time spent with patient: 30 minutes  Date of Admission:  08/09/2015 Date of Discharge: 08/22/15  Reason for Admission:  Bryan Tran 20 yr old male [facility] with manic behavior, disorganized, flipping form topic to topic, irritable and restless.  Patient was hyperactive and delusional. Pt with grandiose delusions of being a defense attorney , fighting crime.  Principal Problem: Bipolar disorder, current episode manic severe with psychotic features [facility](HCC) Discharge Diagnoses: Patient Active Problem List   Diagnosis Date Noted  . Bipolar disorder, current episode manic severe with psychotic features (HCC) [F31.2] 08/10/2015  . Cannabis use disorder, severe, dependence (HCC) [F12.20] 08/08/2015    Past Psychiatric History: [facility]17[facility]2013 [facility]. Pt denies past suicide attempts.Currently he is noncompliant on medications.  Past Medical History:  Past Medical History  Diagnosis Date  . Bipolar I disorder (HCC) manic  . Psychosis   . Marihuana dependence (HCC)    History reviewed. No pertinent past surgical history. Family History:  Family History  Problem Relation Age of Onset  . Hypertension Mother   . Depression Father    Family Psychiatric  History: History:Per mother father has hx of depression and has attempted suicide in the past. Social History:  History  Alcohol Use No     History  Drug Use  . Yes  . Special: Marijuana    Social History   Social History  . Marital Status: Single    Spouse Name: N/A  . Number of Children: N/A  . Years of Education: N/A   Social History Main Topics  . Smoking status: Current Every Day Smoker -- 0.50 packs/day    Types: Cigarettes  . Smokeless  tobacco: None  . Alcohol Use: No  . Drug Use: Yes    Special: Marijuana  . Sexual Activity: Yes    Birth Control/ Protection: None   Other Topics Concern  . None   Social History Narrative    Hospital Course:  Bryan Tran was admitted for Bipolar disorder, current episode manic severe with psychotic features (HCC) and crisis management.  He was treated with the following medications:  For Mood Stabilization he was given Aripiprazole SUSR 400 mg IM to be given every 28 days next dose due Friday 09/11/15 also Depakote ER 1000 mg every evening; Trileptal 150 mg Bid; For Extrapericardial drug symptoms he was given Cogentin 0.58 mg Bid; For Insomnia Trazodone 150 mg at bed time; and Vistaril 25 mg twice daily as needed for anxiety.  Agitation protocol was also implemented with Ativan 1 mg PO or IM.  Patient was also treated with   Bryan Tran was discharged with current medication and was instructed on how to take medications as prescribed; (details listed below under Medication List).  Medical problems were identified and treated as needed.  Home medications were restarted as appropriate.  Improvement was monitored by observation and Bryan SledgeEmmanuel Tran daily report of symptom reduction.  Emotional and mental status was monitored by daily self-inventory reports completed by Bryan Tran and clinical staff.         Bryan Tran was evaluated by the treatment team for stability and plans for continued recovery upon discharge.  Bryan Tran motivation was an integral factor for scheduling  further treatment.  Employment, transportation, bed availability, health status, family support, and any pending legal issues were also considered during his hospital stay.  He was offered further treatment options upon discharge including but not limited to Residential, Intensive Outpatient, and Outpatient treatment.  Bryan Tran will follow up with the services as listed below under Follow Up  Information.     Upon completion of this admission the Bryan Tran was both mentally and medically stable for discharge denying suicidal/homicidal ideation, auditory/visual/tactile hallucinations, delusional thoughts and paranoia.       Physical Findings: AIMS:  , ,  ,  ,    CIWA:    COWS:     Musculoskeletal: Strength & Muscle Tone: within normal limits Gait & Station: normal Patient leans: N/A  Psychiatric Specialty Exam:  See Suicide Risk Assessment Physical Exam  Nursing note and vitals reviewed. Neck: Normal range of motion.  Respiratory: Effort normal.  Musculoskeletal: Normal range of motion.    Review of Systems  Psychiatric/Behavioral: Nervous/anxious: Stable. Insomnia: Stable.   All other systems reviewed and are negative.   Blood pressure 133/77, pulse 79, temperature 97.8 F (36.6 C), temperature source Oral, resp. rate 18, height  (1.753 m), weight 68.947 kg (152 lb).Body mass index is 22.44 kg/(m^2).    Have you used any form of tobacco in the last 30 days? (Cigarettes, Smokeless Tobacco, Cigars, and/or Pipes): Yes  Has this patient used any form of tobacco in the last 30 days? (Cigarettes, Smokeless Tobacco, Cigars, and/or Pipes) Yes, Yes, A prescription for an FDA-approved tobacco cessation medication was offered at discharge and the patient refused  Blood Alcohol level:  Lab Results  Component Value Date   [facility] <5 08/08/2015   ETH <11 09/06/2013    Metabolic Disorder Labs:  Lab Results  Component Value Date   HGBA1C 5.2 08/11/2015   MPG 103 08/11/2015   Lab Results  Component Value Date   PROLACTIN 40.5* 08/11/2015   Lab Results  Component Value Date   CHOL 88 08/11/2015   TRIG 70 08/11/2015   HDL 39* 08/11/2015   CHOLHDL 2.3 08/11/2015   VLDL 14 08/11/2015   LDLCALC 35 08/11/2015    See Psychiatric Specialty Exam and Suicide Risk Assessment completed by Attending Physician prior to discharge.  Discharge destination:  Home  Is  patient on multiple antipsychotic therapies at discharge:  No   Has Patient had three or more failed trials of antipsychotic monotherapy by history:  No  Recommended Plan for Multiple Antipsychotic Therapies: NA  Discharge Instructions    Activity as tolerated - No restrictions    Complete by:  As directed      Diet general    Complete by:  As directed      Discharge instructions    Complete by:  As directed   Take all of you medications as prescribed by your mental healthcare provider.  Report any adverse effects and reactions from your medications to your outpatient provider promptly.  Do not engage in alcohol and or illegal drug use while on prescription medicines. Keep all scheduled appointments. This is to ensure that you are getting refills on time and to avoid any interruption in your medication.  If you are unable to keep an appointment call to reschedule.  Be sure to follow up with resources and follow ups given. In the event of worsening symptoms call the crisis hotline, 911, and or go to the nearest emergency department for appropriate evaluation and treatment of symptoms.  Follow-up with your primary care provider for your medical issues, concerns and or health care needs.            Medication List    STOP taking these medications        carbamazepine 200 MG Cp12 12 hr capsule  Commonly known as:  EQUETRO      TAKE these medications      Indication   ARIPiprazole 400 MG Susr  Inject 400 mg into the muscle every 28 (twenty-eight) days.   Indication:  Schizophrenia     benztropine 0.5 MG tablet  Commonly known as:  COGENTIN  Take 1 tablet (0.5 mg total) by mouth 2 (two) times daily.   Indication:  Extrapyramidal Reaction caused by Medications     divalproex 500 MG 24 hr tablet  Commonly known as:  DEPAKOTE ER  Take 2 tablets (1,000 mg total) by mouth every evening.   Indication:  Mood stabilization     OXcarbazepine 150 MG tablet  Commonly known as:  TRILEPTAL   Take 1 tablet (150 mg total) by mouth 2 (two) times daily.   Indication:  mood stabilization     traZODone 150 MG tablet  Commonly known as:  DESYREL  Take 1 tablet (150 mg total) by mouth at bedtime.   Indication:  Aggressive Behavior, Trouble Sleeping           Follow-up Information    Follow up with [facility].   Specialty:  Behavioral Health   Why:  Go to the walk-in clinic M-F between 8 and 11AM for your hospital follow up appointment   Contact information:   [address] [place] [address] [phone]       Follow-up recommendations:  Activity:  As tolerated Diet:  As tolerated  Comments:  Lael Pilch has been instructed to take medications as prescribed; and report adverse effects to outpatient provider.  Follow up with primary doctor for any medical issues and If symptoms recur report to nearest emergency or crisis hot line.    SignedAssunta Found, NP 08/22/2015, 9:14 AM  I have examined the patient and agree with the discharge plan and findings. I have also done suicide assessment on this patient.

## 2015-08-22 NOTE — [facility] Suicide Risk Assessment (Signed)
[facility] Dis[facility]   Principal Problem: Bipolar disorder, current episode manic severe with psychotic features [facility](HCC) Discharge Diagnoses:  Patient Active Problem List   Diagnosis Date Noted  . Bipolar disorder, current episode manic severe with psychotic features (HCC) [F31.2] 08/10/2015  . Cannabis use disorder, severe, dependence (HCC) [F12.20] 08/08/2015    Total Time spent with patient: 30 minutes  Musculoskeletal: Strength & Muscle Tone: within normal limits Gait & Station: pacing Patient leans: no lean  Psychiatric Specialty Exam: Review of Systems  Cardiovascular: Negative for chest pain.  Neurological: Negative for tremors.  Psychiatric/Behavioral: Negative for suicidal ideas.    Blood pressure 133/77, pulse 79, temperature 97.8 F (36.6 C), temperature source Oral, resp. rate 18, height 5\' 9"  (1.753 m), weight 68.947 kg (152 lb).Body mass index is 22.44 kg/(m^2).  General Appearance: Casual  Eye Contact::  Fair  Speech:  Normal Rate409  Volume:  Normal  Mood:  Euphoric  Affect:  Full Range  Thought Process:  Linear  Orientation:  Full (Time, Place, and Person)  Thought Content:  Rumination  Suicidal Thoughts:  No  Homicidal Thoughts:  No  Memory:  Immediate;   Fair Recent;   Fair  Judgement:  Poor  Insight:  Shallow  Psychomotor Activity:  Increased  Concentration:  Fair  Recall:  [employer]Fair  Fund of Knowledge:Fair  Language: Fair  Akathisia:  Negative  Handed:  Right  AIMS (if indicated):     Assets:  Desire for Improvement Social Support  Sleep:  Number of Hours: 4.25  Cognition: WNL  ADL's:  Intact   Mental Status Per Nursing Assessment::   On Admission:     Demographic Factors:  Low socioeconomic status  Loss Factors: Financial problems/change in socioeconomic status  Historical Factors: Impulsivity  Risk Reduction Factors:   Living with another person, especially a relative  Continued Clinical Symptoms:  Bipolar  Disorder:   Mixed State More than one psychiatric diagnosis  Mixed state ; baseline as per Staff report and history  Cognitive Features That Contribute To Risk:  Closed-mindedness    Suicide Risk:  Mild:  Suicidal ideation of limited frequency, intensity, duration, and specificity.  There are no identifiable plans, no associated intent, mild dysphoria and related symptoms, good self-control (both objective and subjective assessment), few other risk factors, and identifiable protective factors, including available and accessible social support.  Follow-up Information    [facility]   Specialty:  Behavioral Health   Why:  Go to the walk-in clinic M-F between 8 and 11AM for your hospital follow up appointment   Contact information:   [address] [place] [address] [phone]     Patient to be picked up by family  /As per Staff and reports he has reached his baseline. depakote was increased and comparing with prior notes. He is less labile or beligerant. Still talks to himself but not disurptive. Will be followed with actt team as well. Family also wanting him to be discharged and he is being picked up.    Plan Of Care/Follow-up recommendations:  Activity:  as tolerated  Diet: regular  Thresa RossAKHTAR, Shaynah Hund, MD 08/22/2015, 2:56 PM

## 2015-08-22 NOTE — [facility] Group Notes (Signed)
[facility] LCSW Group Therapy Note  08/22/2015 10:00 AM  Type of Therapy and Topic:  Group Therapy: Avoiding Self-Sabotaging and Enabling Behaviors  Participation Level:  Active  Participation Quality:  Intrusive and Monopolizing  Affect:  Anxious and Irritable  Cognitive:  Alert and Oriented  Insight:  Limited  Engagement in Therapy:  Limited   Therapeutic models used Cognitive Behavioral Therapy Person-Centered Therapy Motivational Interviewing   Summary of Patient Progress: The main focus of today's process group was to explain to the adolescent what "self-sabotage" means and use Motivational Interviewing to discuss what benefits, negative or positive, were involved in a self-identified self-sabotaging behavior.Patient required frequent redirection as he would start with direct question and quickly become tangential.    Carney Bernatherine C Carrina Schoenberger, LCSW

## 2015-08-24 NOTE — Progress Notes (Signed)
  [facility] Dis[facility] :  Will you be returning to the same living situation after discharge:  Yes,  home At discharge, do you have transportation home?: Yes,  family Do you have the ability to pay for your medications: Yes,  MCD  Release of information consent forms completed and in the chart;  Patient's signature needed at discharge.  Patient to Follow up at: Follow-up Information    [facility]   Specialty:  Behavioral Health   Why:  Go to the walk-in clinic M-F between 8 and 11AM for your hospital follow up appointment   Contact information:   [address] [place] [address] [phone]       Next level of care provider [facility] Link:no  Safety Planning and Suicide Prevention discussed: Yes,  yes  Have you used any form of tobacco in the last 30 days? (Cigarettes, Smokeless Tobacco, Cigars, and/or Pipes): Yes  Has patient been referred to the Quitline?: Patient refused referral  Patient has been referred for addiction treatment: Yes  Daryel Geraldorth, Kruz Chiu B 08/24/2015, 11:10 AM
